# Patient Record
Sex: Male | Born: 1935 | Race: White | Hispanic: No | Smoking: Former smoker
Health system: Southern US, Community
[De-identification: ages and names within clinical notes are randomized; demographics above are authoritative.]

## PROBLEM LIST (undated history)

## (undated) DIAGNOSIS — H9190 Unspecified hearing loss, unspecified ear: Secondary | ICD-10-CM

## (undated) DIAGNOSIS — R269 Unspecified abnormalities of gait and mobility: Secondary | ICD-10-CM

## (undated) DIAGNOSIS — D649 Anemia, unspecified: Secondary | ICD-10-CM

## (undated) DIAGNOSIS — E785 Hyperlipidemia, unspecified: Secondary | ICD-10-CM

## (undated) DIAGNOSIS — I10 Essential (primary) hypertension: Secondary | ICD-10-CM

## (undated) DIAGNOSIS — E119 Type 2 diabetes mellitus without complications: Secondary | ICD-10-CM

## (undated) DIAGNOSIS — S065X9A Traumatic subdural hemorrhage with loss of consciousness of unspecified duration, initial encounter: Secondary | ICD-10-CM

## (undated) DIAGNOSIS — E871 Hypo-osmolality and hyponatremia: Secondary | ICD-10-CM

## (undated) HISTORY — PX: CERVICAL SPINE SURGERY: SHX589

---
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
## 2015-06-26 ENCOUNTER — Emergency Department (HOSPITAL_BASED_OUTPATIENT_CLINIC_OR_DEPARTMENT_OTHER): Payer: Medicare Other

## 2015-06-26 ENCOUNTER — Inpatient Hospital Stay (HOSPITAL_BASED_OUTPATIENT_CLINIC_OR_DEPARTMENT_OTHER)
Admission: EM | Admit: 2015-06-26 | Discharge: 2015-07-01 | DRG: 083 | Disposition: A | Discharge status: Rehabilitation Facility | Payer: Medicare Other | Attending: General Surgery | Admitting: General Surgery

## 2015-06-26 DIAGNOSIS — R5383 Other fatigue: Secondary | ICD-10-CM | POA: Diagnosis not present

## 2015-06-26 DIAGNOSIS — D62 Acute posthemorrhagic anemia: Secondary | ICD-10-CM | POA: Diagnosis present

## 2015-06-26 DIAGNOSIS — E878 Other disorders of electrolyte and fluid balance, not elsewhere classified: Secondary | ICD-10-CM | POA: Diagnosis not present

## 2015-06-26 DIAGNOSIS — I4891 Unspecified atrial fibrillation: Secondary | ICD-10-CM | POA: Diagnosis present

## 2015-06-26 DIAGNOSIS — E876 Hypokalemia: Secondary | ICD-10-CM | POA: Diagnosis not present

## 2015-06-26 DIAGNOSIS — S065X9A Traumatic subdural hemorrhage with loss of consciousness of unspecified duration, initial encounter: Secondary | ICD-10-CM | POA: Diagnosis present

## 2015-06-26 DIAGNOSIS — E785 Hyperlipidemia, unspecified: Secondary | ICD-10-CM | POA: Diagnosis present

## 2015-06-26 DIAGNOSIS — E871 Hypo-osmolality and hyponatremia: Secondary | ICD-10-CM | POA: Diagnosis present

## 2015-06-26 DIAGNOSIS — Z7984 Long term (current) use of oral hypoglycemic drugs: Secondary | ICD-10-CM | POA: Diagnosis not present

## 2015-06-26 DIAGNOSIS — R269 Unspecified abnormalities of gait and mobility: Secondary | ICD-10-CM | POA: Diagnosis not present

## 2015-06-26 DIAGNOSIS — W1830XA Fall on same level, unspecified, initial encounter: Secondary | ICD-10-CM | POA: Diagnosis present

## 2015-06-26 DIAGNOSIS — I951 Orthostatic hypotension: Secondary | ICD-10-CM | POA: Diagnosis not present

## 2015-06-26 DIAGNOSIS — S069X2S Unspecified intracranial injury with loss of consciousness of 31 minutes to 59 minutes, sequela: Secondary | ICD-10-CM | POA: Diagnosis not present

## 2015-06-26 DIAGNOSIS — Z7982 Long term (current) use of aspirin: Secondary | ICD-10-CM

## 2015-06-26 DIAGNOSIS — I10 Essential (primary) hypertension: Secondary | ICD-10-CM | POA: Diagnosis present

## 2015-06-26 DIAGNOSIS — R296 Repeated falls: Secondary | ICD-10-CM | POA: Diagnosis present

## 2015-06-26 DIAGNOSIS — H9190 Unspecified hearing loss, unspecified ear: Secondary | ICD-10-CM | POA: Diagnosis present

## 2015-06-26 DIAGNOSIS — S065XAA Traumatic subdural hemorrhage with loss of consciousness status unknown, initial encounter: Secondary | ICD-10-CM | POA: Insufficient documentation

## 2015-06-26 DIAGNOSIS — S069X3S Unspecified intracranial injury with loss of consciousness of 1 hour to 5 hours 59 minutes, sequela: Secondary | ICD-10-CM | POA: Diagnosis not present

## 2015-06-26 DIAGNOSIS — Y92002 Bathroom of unspecified non-institutional (private) residence single-family (private) house as the place of occurrence of the external cause: Secondary | ICD-10-CM | POA: Diagnosis not present

## 2015-06-26 DIAGNOSIS — E118 Type 2 diabetes mellitus with unspecified complications: Secondary | ICD-10-CM | POA: Diagnosis not present

## 2015-06-26 DIAGNOSIS — E119 Type 2 diabetes mellitus without complications: Secondary | ICD-10-CM | POA: Diagnosis present

## 2015-06-26 DIAGNOSIS — S065X3A Traumatic subdural hemorrhage with loss of consciousness of 1 hour to 5 hours 59 minutes, initial encounter: Secondary | ICD-10-CM | POA: Diagnosis present

## 2015-06-26 DIAGNOSIS — Z79899 Other long term (current) drug therapy: Secondary | ICD-10-CM | POA: Diagnosis not present

## 2015-06-26 DIAGNOSIS — R402412 Glasgow coma scale score 13-15, at arrival to emergency department: Secondary | ICD-10-CM | POA: Diagnosis present

## 2015-06-26 DIAGNOSIS — F068 Other specified mental disorders due to known physiological condition: Secondary | ICD-10-CM | POA: Insufficient documentation

## 2015-06-26 DIAGNOSIS — S069X0S Unspecified intracranial injury without loss of consciousness, sequela: Secondary | ICD-10-CM

## 2015-06-26 DIAGNOSIS — R350 Frequency of micturition: Secondary | ICD-10-CM | POA: Diagnosis not present

## 2015-06-26 DIAGNOSIS — R413 Other amnesia: Secondary | ICD-10-CM | POA: Diagnosis not present

## 2015-06-26 DIAGNOSIS — I6201 Nontraumatic acute subdural hemorrhage: Secondary | ICD-10-CM | POA: Diagnosis present

## 2015-06-26 HISTORY — DX: Anemia, unspecified: D64.9

## 2015-06-26 HISTORY — DX: Hyperlipidemia, unspecified: E78.5

## 2015-06-26 HISTORY — DX: Unspecified abnormalities of gait and mobility: R26.9

## 2015-06-26 HISTORY — DX: Essential (primary) hypertension: I10

## 2015-06-26 HISTORY — DX: Type 2 diabetes mellitus without complications: E11.9

## 2015-06-26 HISTORY — DX: Unspecified hearing loss, unspecified ear: H91.90

## 2015-06-26 HISTORY — DX: Hypo-osmolality and hyponatremia: E87.1

## 2015-06-26 HISTORY — DX: Traumatic subdural hemorrhage with loss of consciousness of unspecified duration, initial encounter: S06.5X9A

## 2015-06-26 LAB — BASIC METABOLIC PANEL
ANION GAP: 7 (ref 5–15)
BUN: 13 mg/dL (ref 6–20)
CO2: 30 mmol/L (ref 22–32)
Calcium: 9.3 mg/dL (ref 8.9–10.3)
Chloride: 95 mmol/L — ABNORMAL LOW (ref 101–111)
Creatinine, Ser: 0.86 mg/dL (ref 0.61–1.24)
GFR calc Af Amer: >60 mL/min (ref >60)
GFR calc non Af Amer: >60 mL/min (ref >60)
GLUCOSE: 136 mg/dL — AB (ref 65–99)
Potassium: 4 mmol/L (ref 3.5–5.1)
SODIUM: 132 mmol/L — AB (ref 135–145)

## 2015-06-26 LAB — APTT: aPTT: 34 seconds (ref 24–37)

## 2015-06-26 LAB — CBC
HEMATOCRIT: 36.3 % — AB (ref 39.0–52.0)
HEMOGLOBIN: 12.5 g/dL — AB (ref 13.0–17.0)
MCH: 30.4 pg (ref 26.0–34.0)
MCHC: 34.4 g/dL (ref 30.0–36.0)
MCV: 88.3 fL (ref 78.0–100.0)
Platelets: 176 10*3/uL (ref 150–400)
RBC: 4.11 MIL/uL — ABNORMAL LOW (ref 4.22–5.81)
RDW: 12.9 % (ref 11.5–15.5)
WBC: 4.9 10*3/uL (ref 4.0–10.5)

## 2015-06-26 LAB — PROTIME-INR
INR: 0.92 (ref 0.00–1.49)
Prothrombin Time: 12.6 seconds (ref 11.6–15.2)

## 2015-06-26 LAB — CBG MONITORING, ED: Glucose-Capillary: 123 mg/dL — ABNORMAL HIGH (ref 65–99)

## 2015-06-26 MED ORDER — DEXTROSE-NACL 5-0.9 % IV SOLN
INTRAVENOUS | Status: DC
  Administered 2015-06-27 – 2015-06-28 (×2): via INTRAVENOUS

## 2015-06-26 MED ORDER — AMITRIPTYLINE HCL 10 MG PO TABS
10.0000 mg | ORAL_TABLET | Freq: Every day | ORAL | Status: DC
  Administered 2015-06-27 – 2015-06-30 (×4): 10 mg via ORAL
  Filled 2015-06-26 (×6): qty 1

## 2015-06-26 MED ORDER — CITALOPRAM HYDROBROMIDE 20 MG PO TABS
10.0000 mg | ORAL_TABLET | Freq: Every day | ORAL | Status: DC
  Administered 2015-06-27 – 2015-07-01 (×5): 10 mg via ORAL
  Filled 2015-06-26 (×5): qty 1

## 2015-06-26 MED ORDER — CITALOPRAM HYDROBROMIDE 10 MG PO TABS: 10.0000 mg | ORAL_TABLET | Freq: Every day | ORAL | Status: DC

## 2015-06-26 MED ORDER — LISINOPRIL 40 MG PO TABS
40.0000 mg | ORAL_TABLET | Freq: Every day | ORAL | Status: DC
  Administered 2015-06-27 – 2015-07-01 (×5): 40 mg via ORAL
  Filled 2015-06-26 (×5): qty 1

## 2015-06-26 MED ORDER — PANTOPRAZOLE SODIUM 40 MG IV SOLR: 40.0000 mg | Freq: Every day | INTRAVENOUS | Status: DC

## 2015-06-26 MED ORDER — SIMVASTATIN 40 MG PO TABS
40.0000 mg | ORAL_TABLET | Freq: Every day | ORAL | Status: DC
  Administered 2015-06-27 – 2015-06-30 (×4): 40 mg via ORAL
  Filled 2015-06-26 (×4): qty 1

## 2015-06-26 MED ORDER — HYDROCHLOROTHIAZIDE 25 MG PO TABS
25.0000 mg | ORAL_TABLET | Freq: Every day | ORAL | Status: DC
  Administered 2015-06-27 – 2015-07-01 (×5): 25 mg via ORAL
  Filled 2015-06-26 (×5): qty 1

## 2015-06-26 MED ORDER — INSULIN ASPART 100 UNIT/ML ~~LOC~~ SOLN
0.0000 [IU] | Freq: Three times a day (TID) | SUBCUTANEOUS | Status: DC
  Administered 2015-06-27: 2 [IU] via SUBCUTANEOUS
  Administered 2015-06-27 – 2015-06-28 (×3): 3 [IU] via SUBCUTANEOUS
  Administered 2015-06-28 – 2015-07-01 (×6): 2 [IU] via SUBCUTANEOUS

## 2015-06-26 MED ORDER — PANTOPRAZOLE SODIUM 40 MG PO TBEC
40.0000 mg | DELAYED_RELEASE_TABLET | Freq: Every day | ORAL | Status: DC
  Administered 2015-06-27 – 2015-06-29 (×3): 40 mg via ORAL
  Filled 2015-06-26 (×3): qty 1

## 2015-06-26 MED ORDER — ACETAMINOPHEN 500 MG PO TABS
1000.0000 mg | ORAL_TABLET | Freq: Once | ORAL | Status: AC
  Administered 2015-06-26: 1000 mg via ORAL
  Filled 2015-06-26: qty 2

## 2015-06-26 MED ORDER — ONDANSETRON HCL 4 MG/2ML IJ SOLN
4.0000 mg | Freq: Four times a day (QID) | INTRAMUSCULAR | Status: DC | PRN
  Administered 2015-06-27 – 2015-06-30 (×2): 4 mg via INTRAVENOUS
  Filled 2015-06-26 (×3): qty 2

## 2015-06-26 MED ORDER — SIMVASTATIN 40 MG PO TABS: 40.0000 mg | ORAL_TABLET | Freq: Every day | ORAL | Status: DC

## 2015-06-26 MED ORDER — HYDROMORPHONE HCL 1 MG/ML IJ SOLN
0.5000 mg | INTRAMUSCULAR | Status: DC | PRN
  Administered 2015-06-27: 0.5 mg via INTRAVENOUS
  Filled 2015-06-26: qty 1

## 2015-06-26 MED ORDER — LISINOPRIL 40 MG PO TABS: 40.0000 mg | ORAL_TABLET | Freq: Every day | ORAL | Status: DC

## 2015-06-26 MED ORDER — METFORMIN HCL 500 MG PO TABS
500.0000 mg | ORAL_TABLET | Freq: Two times a day (BID) | ORAL | Status: DC
  Administered 2015-06-27 – 2015-07-01 (×9): 500 mg via ORAL
  Filled 2015-06-26 (×9): qty 1

## 2015-06-26 MED ORDER — ONDANSETRON HCL 4 MG PO TABS
4.0000 mg | ORAL_TABLET | Freq: Four times a day (QID) | ORAL | Status: DC | PRN
  Filled 2015-06-26: qty 1

## 2015-06-26 MED ORDER — HYDROCHLOROTHIAZIDE 25 MG PO TABS: 25.0000 mg | ORAL_TABLET | Freq: Every day | ORAL | Status: DC

## 2015-06-26 NOTE — ED Notes (Signed)
Trauma at bedside.

## 2015-06-26 NOTE — ED Notes (Signed)
Pt on automatic VS, continuous pulse ox and cardiac monitoring. 

## 2015-06-26 NOTE — ED Notes (Signed)
Neurosurgery and Trauma notified of patient's arrival

## 2015-06-26 NOTE — ED Notes (Signed)
Pt used urinal in bed to void.

## 2015-06-26 NOTE — H&P (Signed)
History   Steven Duncan is an 82 y.o. male.   Chief Complaint:  Chief Complaint  Patient presents with  . Fall    Fall Associated symptoms include neck pain. Pertinent negatives include no abdominal pain, chest pain or fever.   Asked to see patient after a fall in shower earlier today. Patient struck his head and states he was knocked unconscious. He was seen at the [place] emergency room and found to have small subdural hematoma. Glasgow Coma Scale was 15. Transferred to [place] for neurosurgical and trauma evaluation. CT scan of head shows small subdural hematoma and CT of neck shows chronic postsurgical changes without acute fracture. Patient is hard of hearing. Complains of a headache currently and neck pain. Denies chest pain, abdominal pain, extremity pain, back pain, or pelvic pain. No past medical history on file.  No past surgical history on file.  No family history on file. Social History:  has no tobacco, alcohol, and drug history on file.  Allergies  No Known Allergies  Home Medications   (Not in a hospital admission)  Trauma Course   Results for orders placed or performed during the hospital encounter of 06/26/15 (from the past 48 hour(s))  Basic metabolic panel     Status: Abnormal   Collection Time: 06/26/15  3:25 PM  Result Value Ref Range   Sodium 132 (L) 135 - 145 mmol/L   Potassium 4.0 3.5 - 5.1 mmol/L   Chloride 95 (L) 101 - 111 mmol/L   CO2 30 22 - 32 mmol/L   Glucose, Bld 136 (H) 65 - 99 mg/dL   BUN 13 6 - 20 mg/dL   Creatinine, Ser 0.86 0.61 - 1.24 mg/dL   Calcium 9.3 8.9 - 10.3 mg/dL   GFR calc non Af Amer >60 >60 mL/min   GFR calc Af Amer >60 >60 mL/min    Comment: (NOTE) The eGFR has been calculated using the CKD EPI equation. This calculation has not been validated in all clinical situations. eGFR's persistently <60 mL/min signify possible Chronic Kidney Disease.    Anion gap 7 5 - 15  CBC     Status: Abnormal   Collection Time:  06/26/15  3:25 PM  Result Value Ref Range   WBC 4.9 4.0 - 10.5 K/uL   RBC 4.11 (L) 4.22 - 5.81 MIL/uL   Hemoglobin 12.5 (L) 13.0 - 17.0 g/dL   HCT 36.3 (L) 39.0 - 52.0 %   MCV 88.3 78.0 - 100.0 fL   MCH 30.4 26.0 - 34.0 pg   MCHC 34.4 30.0 - 36.0 g/dL   RDW 12.9 11.5 - 15.5 %   Platelets 176 150 - 400 K/uL  APTT     Status: None   Collection Time: 06/26/15  3:25 PM  Result Value Ref Range   aPTT 34 24 - 37 seconds  Protime-INR     Status: None   Collection Time: 06/26/15  3:25 PM  Result Value Ref Range   Prothrombin Time 12.6 11.6 - 15.2 seconds   INR 0.92 0.00 - 1.49  CBG monitoring, ED     Status: Abnormal   Collection Time: 06/26/15  4:07 PM  Result Value Ref Range   Glucose-Capillary 123 (H) 65 - 99 mg/dL   Ct Head Wo Contrast  06/26/2015  CLINICAL DATA:  Posttraumatic headache and head laceration and left-sided neck pain after fall in shower today. Positive loss of consciousness. EXAM: CT HEAD WITHOUT CONTRAST CT CERVICAL SPINE WITHOUT CONTRAST TECHNIQUE: Multidetector  CT imaging of the head and cervical spine was performed following the standard protocol without intravenous contrast. Multiplanar CT image reconstructions of the cervical spine were also generated. COMPARISON:  None. FINDINGS: CT HEAD FINDINGS Bony calvarium appears intact. Mild left parasagittal subdural hematoma is noted. Mild right tentorial subdural hematoma is noted as well. Ventricular size is within normal limits. No midline shift is noted. Mild diffuse cortical atrophy is noted. There is no evidence of acute infarction or mass lesion. Mild left occipital scalp hematoma is noted. CT CERVICAL SPINE FINDINGS Mild grade 1 anterolisthesis of C4-5 is noted. Patient is status post left hemilaminectomy at C5 and C6. Small defect is seen involving the posterior arc of C1 which most likely represents old fracture. No definite acute fracture is noted. There is noted and old fracture involving the superior tip of the  odontoid process with corticated margins. Visualized lung apices appear normal. Severe degenerative disc disease is noted at C4-5, C5-6, C6-7 and C7-T1. IMPRESSION: Mild diffuse cortical atrophy. Mild left parasagittal and right tentorial subdural hematomas are noted. No significant midline shift is noted. Extensive degenerative and postsurgical changes are noted. Probable old odontoid and C1 posterior arch fractures are noted. No definite evidence of acute fracture is noted. Critical Value/emergent results were called by telephone at the time of interpretation on 06/26/2015 at 5:05 pm to Dr. Orlie Dakin , who verbally acknowledged these results. Electronically Signed   By: Marijo Conception, M.D.   On: 06/26/2015 17:05   Ct Cervical Spine Wo Contrast  06/26/2015  CLINICAL DATA:  Posttraumatic headache and head laceration and left-sided neck pain after fall in shower today. Positive loss of consciousness. EXAM: CT HEAD WITHOUT CONTRAST CT CERVICAL SPINE WITHOUT CONTRAST TECHNIQUE: Multidetector CT imaging of the head and cervical spine was performed following the standard protocol without intravenous contrast. Multiplanar CT image reconstructions of the cervical spine were also generated. COMPARISON:  None. FINDINGS: CT HEAD FINDINGS Bony calvarium appears intact. Mild left parasagittal subdural hematoma is noted. Mild right tentorial subdural hematoma is noted as well. Ventricular size is within normal limits. No midline shift is noted. Mild diffuse cortical atrophy is noted. There is no evidence of acute infarction or mass lesion. Mild left occipital scalp hematoma is noted. CT CERVICAL SPINE FINDINGS Mild grade 1 anterolisthesis of C4-5 is noted. Patient is status post left hemilaminectomy at C5 and C6. Small defect is seen involving the posterior arc of C1 which most likely represents old fracture. No definite acute fracture is noted. There is noted and old fracture involving the superior tip of the odontoid  process with corticated margins. Visualized lung apices appear normal. Severe degenerative disc disease is noted at C4-5, C5-6, C6-7 and C7-T1. IMPRESSION: Mild diffuse cortical atrophy. Mild left parasagittal and right tentorial subdural hematomas are noted. No significant midline shift is noted. Extensive degenerative and postsurgical changes are noted. Probable old odontoid and C1 posterior arch fractures are noted. No definite evidence of acute fracture is noted. Critical Value/emergent results were called by telephone at the time of interpretation on 06/26/2015 at 5:05 pm to Dr. Orlie Dakin , who verbally acknowledged these results. Electronically Signed   By: Marijo Conception, M.D.   On: 06/26/2015 17:05    Review of Systems  Constitutional: Negative for fever.  HENT: Positive for hearing loss.   Eyes: Negative for blurred vision and double vision.  Respiratory: Negative for shortness of breath.   Cardiovascular: Negative for chest pain.  Gastrointestinal: Negative for  abdominal pain.  Musculoskeletal: Positive for falls and neck pain. Negative for back pain.  Endo/Heme/Allergies: Bruises/bleeds easily.  Psychiatric/Behavioral: Negative.     Blood pressure 165/89, pulse 87, temperature 98.1 F (36.7 C), temperature source Oral, resp. rate 20, SpO2 98 %. Physical Exam  Constitutional: He is oriented to person, place, and time. He appears well-developed and well-nourished.  HENT:  Head:    Eyes: Pupils are equal, round, and reactive to light. No scleral icterus.  Neck: Normal range of motion. Neck supple.  No bony tenderness full range of motion without pain  Cardiovascular: Normal rate.   Respiratory: Effort normal and breath sounds normal.  GI: Soft.  Musculoskeletal: Normal range of motion.  Neurological: He is alert and oriented to person, place, and time. He has normal strength. No sensory deficit. GCS eye subscore is 4. GCS verbal subscore is 5. GCS motor subscore is 6.    Skin: Skin is warm and dry.  Psychiatric: He has a normal mood and affect. His behavior is normal.     Assessment/Plan  Fall from level ground with LOC   Subdural hematoma    Dr Cyndy Freeze aware   Diabetes mellitus type 2   SSI   HTN  PLACE BACK ON MEDS    Admit to stepdown    Mikyah Alamo A. 06/26/2015, 7:53 PM   Procedures

## 2015-06-26 NOTE — ED Notes (Signed)
Pt had fall earlier today in shower, +LOC, pt on ASA.  Family came over to his house and he was wondering around house disoriented.  PT A/O x 3 today

## 2015-06-26 NOTE — ED Provider Notes (Addendum)
CSN: [id number]     Arrival date & time 06/26/15  1512 History   None    Chief Complaint  Patient presents with  . Fall     (Consider location/radiation/quality/duration/timing/severity/associated sxs/prior Treatment) HPI Patient fell in the shower 1:30 PM today. He struck his head and suffered loss of consciousness lasting approximately one hour. He was confused after the event. He and his daughter who accompany him and lives with him He doubt that he had a syncopal event. His daughter reports that he suffers from frequent falls and he is followed in the shower in the past. He complains of headache and neck pain since the event. He was nauseated and confused earlier today immediately following the fall but is no longer nauseated and acting as normal self per his daughter No past medical history on file. No past surgical history on file. Hypertension diabetes No family history on file. Social History  Substance Use Topics  . Smoking status: Not on file  . Smokeless tobacco: Not on file  . Alcohol Use: Not on file  No tobacco no alcohol no drugs  Review of Systems  Constitutional: Negative.   HENT: Positive for hearing loss.        Hard of hearing  Respiratory: Negative.   Cardiovascular: Negative.   Gastrointestinal: Positive for nausea.  Musculoskeletal: Positive for gait problem.        Walks with cane  Skin: Positive for wound.  Allergic/Immunologic: Positive for immunocompromised state.       Diabetic  Neurological: Positive for headaches.  Psychiatric/Behavioral: Negative.   All other systems reviewed and are negative.     Allergies  Review of patient's allergies indicates no known allergies.  Home Medications   Prior to Admission medications   Medication Sig Start Date End Date Taking? Authorizing Provider  amitriptyline (ELAVIL) 10 MG tablet Take 10 mg by mouth at bedtime.   Yes Historical Provider, MD  aspirin 81 MG tablet Take 81 mg by mouth daily.   Yes  Historical Provider, MD  citalopram (CELEXA) 10 MG tablet Take 10 mg by mouth daily.   Yes Historical Provider, MD  Fish Oil-Cholecalciferol (FISH OIL + D3 PO) Take by mouth.   Yes Historical Provider, MD  glucosamine-chondroitin 500-400 MG tablet Take 1 tablet by mouth 3 (three) times daily.   Yes Historical Provider, MD  glucose blood test strip 1 each by Other route as needed for other. Use as instructed   Yes Historical Provider, MD  hydrochlorothiazide (HYDRODIURIL) 25 MG tablet Take 25 mg by mouth daily.   Yes Historical Provider, MD  Lancets MISC by Does not apply route.   Yes Historical Provider, MD  lisinopril (PRINIVIL,ZESTRIL) 40 MG tablet Take 40 mg by mouth daily.   Yes Historical Provider, MD  metFORMIN (GLUCOPHAGE) 500 MG tablet Take by mouth 2 (two) times daily with a meal.   Yes Historical Provider, MD  simvastatin (ZOCOR) 40 MG tablet Take 40 mg by mouth daily.   Yes Historical Provider, MD   BP 186/103 mmHg  Pulse 76  Temp(Src) 97.8 F (36.6 C) (Oral)  Resp 20  SpO2 99% Physical Exam  Constitutional: He is oriented to person, place, and time. He appears well-developed and well-nourished. No distress.  Alert Glasgow Coma Score 15  HENT:  Hematoma with 1 cm laceration over left mastoid process. No active bleeding. 2 mm laceration over left forehead with no surrounding hematoma. Otherwise normocephalic atraumatic  Eyes: Conjunctivae are normal. Pupils are equal, round,  and reactive to light.  Neck: Neck supple. No tracheal deviation present. No thyromegaly present.  Mild midline tenderness posteriorly to no deformity  Cardiovascular: Normal rate and regular rhythm.   No murmur heard. Pulmonary/Chest: Effort normal and breath sounds normal.  Abdominal: Soft. Bowel sounds are normal. He exhibits no distension. There is no tenderness.  Musculoskeletal: Normal range of motion. He exhibits no edema or tenderness.  Neurological: He is alert and oriented to person, place, and  time. No cranial nerve deficit. Coordination normal.  Motor strength 5 over 5 overall  Skin: Skin is warm and dry. No rash noted.  Psychiatric: He has a normal mood and affect.  Nursing note and vitals reviewed.   ED Course  Procedures (including critical care time) Labs Review Labs Reviewed  CBC - Abnormal; Notable for the following:    RBC 4.11 (*)    Hemoglobin 12.5 (*)    HCT 36.3 (*)    All other components within normal limits  BASIC METABOLIC PANEL  POCT CBG (FASTING - GLUCOSE)-MANUAL ENTRY    Imaging Review No results found. I have personally reviewed and evaluated these images and lab results as part of my medical decision-making.   EKG Interpretation   Date/Time:  Friday June 26 2015 15:49:29 EDT Ventricular Rate:  73 PR Interval:  244 QRS Duration: 104 QT Interval:  450 QTC Calculation: 496 R Axis:   42 Text Interpretation:  Sinus rhythm Prolonged PR interval Abnormal R-wave  progression, early transition Borderline prolonged QT interval Baseline  wander in lead(s) V5 No old tracing to compare Confirmed by Ethelda Chick   MD, Lillionna Nabi [id number]) on 06/26/2015 3:56:42 PM     5:30 PM patient is alert Glasgow Coma Score 15. Offers no distress. Results for orders placed or performed during the hospital encounter of 06/26/15  Basic metabolic panel  Result Value Ref Range   Sodium 132 (L) 135 - 145 mmol/L   Potassium 4.0 3.5 - 5.1 mmol/L   Chloride 95 (L) 101 - 111 mmol/L   CO2 30 22 - 32 mmol/L   Glucose, Bld 136 (H) 65 - 99 mg/dL   BUN 13 6 - 20 mg/dL   Creatinine, Ser 6.04 0.61 - 1.24 mg/dL   Calcium 9.3 8.9 - 54.0 mg/dL   GFR calc non Af Amer >60 >60 mL/min   GFR calc Af Amer >60 >60 mL/min   Anion gap 7 5 - 15  CBC  Result Value Ref Range   WBC 4.9 4.0 - 10.5 K/uL   RBC 4.11 (L) 4.22 - 5.81 MIL/uL   Hemoglobin 12.5 (L) 13.0 - 17.0 g/dL   HCT 98.1 (L) 19.1 - 47.8 %   MCV 88.3 78.0 - 100.0 fL   MCH 30.4 26.0 - 34.0 pg   MCHC 34.4 30.0 - 36.0 g/dL   RDW  29.5 62.1 - 30.8 %   Platelets 176 150 - 400 K/uL  APTT  Result Value Ref Range   aPTT 34 24 - 37 seconds  Protime-INR  Result Value Ref Range   Prothrombin Time 12.6 11.6 - 15.2 seconds   INR 0.92 0.00 - 1.49  CBG monitoring, ED  Result Value Ref Range   Glucose-Capillary 123 (H) 65 - 99 mg/dL   Ct Head Wo Contrast  06/26/2015  CLINICAL DATA:  Posttraumatic headache and head laceration and left-sided neck pain after fall in shower today. Positive loss of consciousness. EXAM: CT HEAD WITHOUT CONTRAST CT CERVICAL SPINE WITHOUT CONTRAST TECHNIQUE: Multidetector CT imaging of  the head and cervical spine was performed following the standard protocol without intravenous contrast. Multiplanar CT image reconstructions of the cervical spine were also generated. COMPARISON:  None. FINDINGS: CT HEAD FINDINGS Bony calvarium appears intact. Mild left parasagittal subdural hematoma is noted. Mild right tentorial subdural hematoma is noted as well. Ventricular size is within normal limits. No midline shift is noted. Mild diffuse cortical atrophy is noted. There is no evidence of acute infarction or mass lesion. Mild left occipital scalp hematoma is noted. CT CERVICAL SPINE FINDINGS Mild grade 1 anterolisthesis of C4-5 is noted. Patient is status post left hemilaminectomy at C5 and C6. Small defect is seen involving the posterior arc of C1 which most likely represents old fracture. No definite acute fracture is noted. There is noted and old fracture involving the superior tip of the odontoid process with corticated margins. Visualized lung apices appear normal. Severe degenerative disc disease is noted at C4-5, C5-6, C6-7 and C7-T1. IMPRESSION: Mild diffuse cortical atrophy. Mild left parasagittal and right tentorial subdural hematomas are noted. No significant midline shift is noted. Extensive degenerative and postsurgical changes are noted. Probable old odontoid and C1 posterior arch fractures are noted. No  definite evidence of acute fracture is noted. Critical Value/emergent results were called by telephone at the time of interpretation on 06/26/2015 at 5:05 pm to Dr. Doug Sou , who verbally acknowledged these results. Electronically Signed   By: Lupita Raider, M.D.   On: 06/26/2015 17:05   Ct Cervical Spine Wo Contrast  06/26/2015  CLINICAL DATA:  Posttraumatic headache and head laceration and left-sided neck pain after fall in shower today. Positive loss of consciousness. EXAM: CT HEAD WITHOUT CONTRAST CT CERVICAL SPINE WITHOUT CONTRAST TECHNIQUE: Multidetector CT imaging of the head and cervical spine was performed following the standard protocol without intravenous contrast. Multiplanar CT image reconstructions of the cervical spine were also generated. COMPARISON:  None. FINDINGS: CT HEAD FINDINGS Bony calvarium appears intact. Mild left parasagittal subdural hematoma is noted. Mild right tentorial subdural hematoma is noted as well. Ventricular size is within normal limits. No midline shift is noted. Mild diffuse cortical atrophy is noted. There is no evidence of acute infarction or mass lesion. Mild left occipital scalp hematoma is noted. CT CERVICAL SPINE FINDINGS Mild grade 1 anterolisthesis of C4-5 is noted. Patient is status post left hemilaminectomy at C5 and C6. Small defect is seen involving the posterior arc of C1 which most likely represents old fracture. No definite acute fracture is noted. There is noted and old fracture involving the superior tip of the odontoid process with corticated margins. Visualized lung apices appear normal. Severe degenerative disc disease is noted at C4-5, C5-6, C6-7 and C7-T1. IMPRESSION: Mild diffuse cortical atrophy. Mild left parasagittal and right tentorial subdural hematomas are noted. No significant midline shift is noted. Extensive degenerative and postsurgical changes are noted. Probable old odontoid and C1 posterior arch fractures are noted. No definite  evidence of acute fracture is noted. Critical Value/emergent results were called by telephone at the time of interpretation on 06/26/2015 at 5:05 pm to Dr. Doug Sou , who verbally acknowledged these results. Electronically Signed   By: Lupita Raider, M.D.   On: 06/26/2015 17:05    MDM  Lacerations do not require repair. Patient is up-to-date on tetanus immunization. I consulted Dr.Ditty neurosurgery who defers admission to the trauma service. He feels that patient be watched overnight on step down unit He will see patient in consultation. I consulted Dr. Luisa Hart  who [facility]. He accepts patient in transfer. I also spoke with Dr.Mckeun, emergency physician who is aware of patient's arrival Final diagnoses:  None   Dx #1 acute subdural hematoma #2 hyperglycermia #3 elevated blood pressure CRITICAL CARE Performed by: Doug SouJACUBOWITZ,Benjie Ricketson Total critical care time: 30 minutes Critical care time was exclusive of separately billable procedures and treating other patients. Critical care was necessary to treat or prevent imminent or life-threatening deterioration. Critical care was time spent personally by me on the following activities: development of treatment plan with patient and/or surrogate as well as nursing, discussions with consultants, evaluation of patient's response to treatment, examination of patient, obtaining history from patient or surrogate, ordering and performing treatments and interventions, ordering and review of laboratory studies, ordering and review of radiographic studies, pulse oximetry and re-evaluation of patient's condition.    Doug SouSam Naz Denunzio, MD 06/26/15 Elfredia [id number]  Doug SouSam Prudencio Velazco, MD 06/26/15 [phone]

## 2015-06-27 DIAGNOSIS — S065X9A Traumatic subdural hemorrhage with loss of consciousness of unspecified duration, initial encounter: Secondary | ICD-10-CM

## 2015-06-27 DIAGNOSIS — S065XAA Traumatic subdural hemorrhage with loss of consciousness status unknown, initial encounter: Secondary | ICD-10-CM

## 2015-06-27 DIAGNOSIS — E871 Hypo-osmolality and hyponatremia: Secondary | ICD-10-CM

## 2015-06-27 HISTORY — DX: Traumatic subdural hemorrhage with loss of consciousness of unspecified duration, initial encounter: S06.5X9A

## 2015-06-27 HISTORY — DX: Traumatic subdural hemorrhage with loss of consciousness status unknown, initial encounter: S06.5XAA

## 2015-06-27 HISTORY — DX: Hypo-osmolality and hyponatremia: E87.1

## 2015-06-27 LAB — CBC
HCT: 36 % — ABNORMAL LOW (ref 39.0–52.0)
Hemoglobin: 12.1 g/dL — ABNORMAL LOW (ref 13.0–17.0)
MCH: 29 pg (ref 26.0–34.0)
MCHC: 33.6 g/dL (ref 30.0–36.0)
MCV: 86.3 fL (ref 78.0–100.0)
PLATELETS: 175 10*3/uL (ref 150–400)
RBC: 4.17 MIL/uL — ABNORMAL LOW (ref 4.22–5.81)
RDW: 13.1 % (ref 11.5–15.5)
WBC: 5.8 10*3/uL (ref 4.0–10.5)

## 2015-06-27 LAB — COMPREHENSIVE METABOLIC PANEL
ALT: 19 U/L (ref 17–63)
AST: 29 U/L (ref 15–41)
Albumin: 3.8 g/dL (ref 3.5–5.0)
Alkaline Phosphatase: 44 U/L (ref 38–126)
Anion gap: 9 (ref 5–15)
BUN: 6 mg/dL (ref 6–20)
CALCIUM: 8.5 mg/dL — AB (ref 8.9–10.3)
CO2: 26 mmol/L (ref 22–32)
CREATININE: 0.8 mg/dL (ref 0.61–1.24)
Chloride: 92 mmol/L — ABNORMAL LOW (ref 101–111)
GFR calc Af Amer: >60 mL/min (ref >60)
Glucose, Bld: 181 mg/dL — ABNORMAL HIGH (ref 65–99)
Potassium: 3 mmol/L — ABNORMAL LOW (ref 3.5–5.1)
SODIUM: 127 mmol/L — AB (ref 135–145)
TOTAL PROTEIN: 6.7 g/dL (ref 6.5–8.1)
Total Bilirubin: 0.8 mg/dL (ref 0.3–1.2)

## 2015-06-27 LAB — GLUCOSE, CAPILLARY
GLUCOSE-CAPILLARY: 150 mg/dL — AB (ref 65–99)
GLUCOSE-CAPILLARY: 159 mg/dL — AB (ref 65–99)
GLUCOSE-CAPILLARY: 137 mg/dL — AB (ref 65–99)
Glucose-Capillary: 200 mg/dL — ABNORMAL HIGH (ref 65–99)
Glucose-Capillary: 138 mg/dL — ABNORMAL HIGH (ref 65–99)

## 2015-06-27 LAB — TROPONIN I

## 2015-06-27 LAB — MRSA PCR SCREENING: MRSA by PCR: NEGATIVE

## 2015-06-27 LAB — MAGNESIUM: Magnesium: 1.7 mg/dL (ref 1.7–2.4)

## 2015-06-27 MED ORDER — MAGNESIUM SULFATE 2 GM/50ML IV SOLN
2.0000 g | Freq: Once | INTRAVENOUS | Status: AC
  Administered 2015-06-27: 2 g via INTRAVENOUS
  Filled 2015-06-27: qty 50

## 2015-06-27 MED ORDER — POTASSIUM CHLORIDE CRYS ER 20 MEQ PO TBCR
40.0000 meq | EXTENDED_RELEASE_TABLET | Freq: Every day | ORAL | Status: DC
  Administered 2015-06-27 – 2015-07-01 (×5): 40 meq via ORAL
  Filled 2015-06-27 (×5): qty 2

## 2015-06-27 MED ORDER — METOPROLOL TARTRATE 1 MG/ML IV SOLN
5.0000 mg | Freq: Four times a day (QID) | INTRAVENOUS | Status: DC | PRN
  Administered 2015-06-30: 5 mg via INTRAVENOUS
  Filled 2015-06-27: qty 5

## 2015-06-27 MED ORDER — METOPROLOL TARTRATE 25 MG PO TABS
25.0000 mg | ORAL_TABLET | Freq: Two times a day (BID) | ORAL | Status: DC
  Administered 2015-06-27 – 2015-07-01 (×9): 25 mg via ORAL
  Filled 2015-06-27 (×9): qty 1

## 2015-06-27 MED ORDER — ACETAMINOPHEN 325 MG PO TABS
650.0000 mg | ORAL_TABLET | ORAL | Status: DC | PRN
  Administered 2015-06-27: 325 mg via ORAL
  Administered 2015-06-29: 650 mg via ORAL
  Filled 2015-06-27 (×2): qty 2

## 2015-06-27 MED ORDER — OXYCODONE HCL 5 MG PO TABS
5.0000 mg | ORAL_TABLET | ORAL | Status: DC | PRN
  Administered 2015-06-28 – 2015-06-29 (×5): 5 mg via ORAL
  Filled 2015-06-27 (×5): qty 1

## 2015-06-27 MED ORDER — POTASSIUM CHLORIDE 10 MEQ/100ML IV SOLN
10.0000 meq | INTRAVENOUS | Status: AC
  Administered 2015-06-27 (×4): 10 meq via INTRAVENOUS
  Filled 2015-06-27 (×4): qty 100

## 2015-06-27 NOTE — Consult Note (Signed)
CC:  Chief Complaint  Patient presents with  . Fall    HPI: Steven Duncan is a 82 y.o. male s/p fall from standing while in shower yesterday.  + LOC.  No neurological complaints aside from headache.  Complains of being hungry at this time.  PMH: No past medical history on file.  PSH: No past surgical history on file.  SH: Social History  Substance Use Topics  . Smoking status: Not on file  . Smokeless tobacco: Not on file  . Alcohol Use: Not on file    MEDS: Prior to Admission medications   Medication Sig Start Date End Date Taking? Authorizing Provider  amitriptyline (ELAVIL) 10 MG tablet Take 10 mg by mouth at bedtime.   Yes Historical Provider, MD  aspirin 81 MG tablet Take 81 mg by mouth daily.   Yes Historical Provider, MD  citalopram (CELEXA) 10 MG tablet Take 10 mg by mouth daily.   Yes Historical Provider, MD  Fish Oil-Cholecalciferol (FISH OIL + D3 PO) Take by mouth.   Yes Historical Provider, MD  glucosamine-chondroitin 500-400 MG tablet Take 1 tablet by mouth 3 (three) times daily.   Yes Historical Provider, MD  glucose blood test strip 1 each by Other route as needed for other. Use as instructed   Yes Historical Provider, MD  hydrochlorothiazide (HYDRODIURIL) 25 MG tablet Take 25 mg by mouth daily.   Yes Historical Provider, MD  Lancets MISC by Does not apply route.   Yes Historical Provider, MD  lisinopril (PRINIVIL,ZESTRIL) 40 MG tablet Take 40 mg by mouth daily.   Yes Historical Provider, MD  metFORMIN (GLUCOPHAGE) 500 MG tablet Take by mouth 2 (two) times daily with a meal.   Yes Historical Provider, MD  simvastatin (ZOCOR) 40 MG tablet Take 40 mg by mouth every evening.    Yes Historical Provider, MD    ALLERGY: No Known Allergies  ROS: ROS  Mild headache.  Hungry.  Was nauseated earlier.  Denies weakness or numbness.  NEUROLOGIC EXAM: Awake, alert, oriented Memory and concentration grossly intact Speech fluent, appropriate CN grossly intact Motor  exam: Upper Extremities Deltoid Bicep Tricep Grip  Right 5/5 5/5 5/5 5/5  Left 5/5 5/5 5/5 5/5   Lower Extremity IP Quad PF DF EHL  Right 5/5 5/5 5/5 5/5 5/5  Left 5/5 5/5 5/5 5/5 5/5   Sensation grossly intact to LT  IMAGING: Small falcine and tentorial subdural hematoma.  IMPRESSION: - 82 y.o. male with mild traumatic brain injury.  Small falcine and tentorial subdural hematomas.  No role for surgical intervention.  PLAN: - Ok to d/c when cleared by therapy - Follow up with me in 3-4 weeks with non-contrast head CT - No need to repeat CT this admission

## 2015-06-27 NOTE — Progress Notes (Signed)
MD notified Patient converted from Normal Sinus Rhythm to Atrial fibulation

## 2015-06-27 NOTE — Progress Notes (Signed)
Subjective: C/o hunger.  No other complaints.  Had some atrial fibrillation last night.  Headache improved.    Objective: Vital signs in last 24 hours: Temp:  [97.3 F (36.3 C)-98.1 F (36.7 C)] 97.8 F (36.6 C) (04/01 0329) Pulse Rate:  [72-91] 72 (04/01 0329) Resp:  [11-20] 15 (04/01 0329) BP: (113-195)/(82-121) 113/82 mmHg (04/01 0329) SpO2:  [91 %-100 %] 92 % (04/01 0329) Weight:  [70 kg (154 lb 5.2 oz)] 70 kg (154 lb 5.2 oz) (03/31 2245)    Intake/Output from previous day: 03/31 0701 - 04/01 0700 In: 450 [I.V.:450] Out: 750 [Urine:750] Intake/Output this shift:    General appearance: alert, cooperative and no distress Eyes: PERRL.  Left lateral orbit with small laceration Resp: clear to auscultation bilaterally Cardio: irregularly irregular rhythm GI: soft, non-tender; bowel sounds normal; no masses,  no organomegaly Extremities: extremities normal, atraumatic, no cyanosis or edema Neuro - very hard of hearing.    Lab Results:   Recent Labs  06/26/15 1525 06/27/15 0300  WBC 4.9 5.8  HGB 12.5* 12.1*  HCT 36.3* 36.0*  PLT 176 175   BMET  Recent Labs  06/26/15 1525 06/27/15 0300  NA 132* 127*  K 4.0 3.0*  CL 95* 92*  CO2 30 26  GLUCOSE 136* 181*  BUN 13 6  CREATININE 0.86 0.80  CALCIUM 9.3 8.5*   PT/INR  Recent Labs  06/26/15 1525  LABPROT 12.6  INR 0.92   ABG No results for input(s): PHART, HCO3 in the last 72 hours.  Invalid input(s): PCO2, PO2  Studies/Results: Ct Head Wo Contrast  06/26/2015  CLINICAL DATA:  Posttraumatic headache and head laceration and left-sided neck pain after fall in shower today. Positive loss of consciousness. EXAM: CT HEAD WITHOUT CONTRAST CT CERVICAL SPINE WITHOUT CONTRAST TECHNIQUE: Multidetector CT imaging of the head and cervical spine was performed following the standard protocol without intravenous contrast. Multiplanar CT image reconstructions of the cervical spine were also generated. COMPARISON:  None.  FINDINGS: CT HEAD FINDINGS Bony calvarium appears intact. Mild left parasagittal subdural hematoma is noted. Mild right tentorial subdural hematoma is noted as well. Ventricular size is within normal limits. No midline shift is noted. Mild diffuse cortical atrophy is noted. There is no evidence of acute infarction or mass lesion. Mild left occipital scalp hematoma is noted. CT CERVICAL SPINE FINDINGS Mild grade 1 anterolisthesis of C4-5 is noted. Patient is status post left hemilaminectomy at C5 and C6. Small defect is seen involving the posterior arc of C1 which most likely represents old fracture. No definite acute fracture is noted. There is noted and old fracture involving the superior tip of the odontoid process with corticated margins. Visualized lung apices appear normal. Severe degenerative disc disease is noted at C4-5, C5-6, C6-7 and C7-T1. IMPRESSION: Mild diffuse cortical atrophy. Mild left parasagittal and right tentorial subdural hematomas are noted. No significant midline shift is noted. Extensive degenerative and postsurgical changes are noted. Probable old odontoid and C1 posterior arch fractures are noted. No definite evidence of acute fracture is noted. Critical Value/emergent results were called by telephone at the time of interpretation on 06/26/2015 at 5:05 pm to Dr. Doug SouSAM JACUBOWITZ , who verbally acknowledged these results. Electronically Signed   By: Lupita RaiderJames  Green Jr, M.D.   On: 06/26/2015 17:05   Ct Cervical Spine Wo Contrast  06/26/2015  CLINICAL DATA:  Posttraumatic headache and head laceration and left-sided neck pain after fall in shower today. Positive loss of consciousness. EXAM: CT HEAD WITHOUT  CONTRAST CT CERVICAL SPINE WITHOUT CONTRAST TECHNIQUE: Multidetector CT imaging of the head and cervical spine was performed following the standard protocol without intravenous contrast. Multiplanar CT image reconstructions of the cervical spine were also generated. COMPARISON:  None. FINDINGS:  CT HEAD FINDINGS Bony calvarium appears intact. Mild left parasagittal subdural hematoma is noted. Mild right tentorial subdural hematoma is noted as well. Ventricular size is within normal limits. No midline shift is noted. Mild diffuse cortical atrophy is noted. There is no evidence of acute infarction or mass lesion. Mild left occipital scalp hematoma is noted. CT CERVICAL SPINE FINDINGS Mild grade 1 anterolisthesis of C4-5 is noted. Patient is status post left hemilaminectomy at C5 and C6. Small defect is seen involving the posterior arc of C1 which most likely represents old fracture. No definite acute fracture is noted. There is noted and old fracture involving the superior tip of the odontoid process with corticated margins. Visualized lung apices appear normal. Severe degenerative disc disease is noted at C4-5, C5-6, C6-7 and C7-T1. IMPRESSION: Mild diffuse cortical atrophy. Mild left parasagittal and right tentorial subdural hematomas are noted. No significant midline shift is noted. Extensive degenerative and postsurgical changes are noted. Probable old odontoid and C1 posterior arch fractures are noted. No definite evidence of acute fracture is noted. Critical Value/emergent results were called by telephone at the time of interpretation on 06/26/2015 at 5:05 pm to Dr. Doug Sou , who verbally acknowledged these results. Electronically Signed   By: Lupita Raider, M.D.   On: 06/26/2015 17:05    Anti-infectives: Anti-infectives    None      Assessment/Plan: s/p Fall wtih SDH DM type 2- metformin and SSI.   HTN- HCTZ Atrial fibrillation- suspect related to hypokalemia and relative hypomagnesemia.  Will place on beta blockade.   Hypokalemia - replete  Hyponatremia/hypochloremia- NS.  Recheck in AM.    Almond Lint 06/27/2015

## 2015-06-28 LAB — BASIC METABOLIC PANEL
ANION GAP: 5 (ref 5–15)
BUN: 11 mg/dL (ref 6–20)
CHLORIDE: 97 mmol/L — AB (ref 101–111)
CO2: 27 mmol/L (ref 22–32)
Calcium: 8.1 mg/dL — ABNORMAL LOW (ref 8.9–10.3)
Creatinine, Ser: 0.82 mg/dL (ref 0.61–1.24)
GFR calc Af Amer: >60 mL/min (ref >60)
GFR calc non Af Amer: >60 mL/min (ref >60)
GLUCOSE: 163 mg/dL — AB (ref 65–99)
POTASSIUM: 3.9 mmol/L (ref 3.5–5.1)
Sodium: 129 mmol/L — ABNORMAL LOW (ref 135–145)

## 2015-06-28 LAB — GLUCOSE, CAPILLARY
GLUCOSE-CAPILLARY: 101 mg/dL — AB (ref 65–99)
Glucose-Capillary: 128 mg/dL — ABNORMAL HIGH (ref 65–99)
Glucose-Capillary: 160 mg/dL — ABNORMAL HIGH (ref 65–99)
Glucose-Capillary: 127 mg/dL — ABNORMAL HIGH (ref 65–99)

## 2015-06-28 LAB — MAGNESIUM: Magnesium: 1.9 mg/dL (ref 1.7–2.4)

## 2015-06-28 NOTE — Progress Notes (Signed)
Report called to Jessica RN on 5W. 

## 2015-06-28 NOTE — Progress Notes (Signed)
Subjective: Doing ok neck sore   Objective: Vital signs in last 24 hours: Temp:  [98.1 F (36.7 C)-98.6 F (37 C)] 98.2 F (36.8 C) (04/02 0311) Pulse Rate:  [62-67] 65 (04/02 0808) Resp:  [17-21] 20 (04/02 0311) BP: (112-186)/(59-96) 186/96 mmHg (04/02 0808) SpO2:  [94 %-97 %] 97 % (04/02 0311) Last BM Date: 06/26/15  Intake/Output from previous day: 04/01 0701 - 04/02 0700 In: 2087.1 [P.O.:120; I.V.:1717.1; IV Piggyback:250] Out: 1000 [Urine:1000] Intake/Output this shift:    General appearance: alert and cooperative Neck: sore bruised posterior neck ROM normal  Resp: clear to auscultation bilaterally Cardio: a fib rate controlled  Neurologic: Grossly normal  Lab Results:   Recent Labs  06/26/15 1525 06/27/15 0300  WBC 4.9 5.8  HGB 12.5* 12.1*  HCT 36.3* 36.0*  PLT 176 175   BMET  Recent Labs  06/27/15 0300 06/28/15 0430  NA 127* 129*  K 3.0* 3.9  CL 92* 97*  CO2 26 27  GLUCOSE 181* 163*  BUN 6 11  CREATININE 0.80 0.82  CALCIUM 8.5* 8.1*   PT/INR  Recent Labs  06/26/15 1525  LABPROT 12.6  INR 0.92   ABG No results for input(s): PHART, HCO3 in the last 72 hours.  Invalid input(s): PCO2, PO2  Studies/Results: Ct Head Wo Contrast  06/26/2015  CLINICAL DATA:  Posttraumatic headache and head laceration and left-sided neck pain after fall in shower today. Positive loss of consciousness. EXAM: CT HEAD WITHOUT CONTRAST CT CERVICAL SPINE WITHOUT CONTRAST TECHNIQUE: Multidetector CT imaging of the head and cervical spine was performed following the standard protocol without intravenous contrast. Multiplanar CT image reconstructions of the cervical spine were also generated. COMPARISON:  None. FINDINGS: CT HEAD FINDINGS Bony calvarium appears intact. Mild left parasagittal subdural hematoma is noted. Mild right tentorial subdural hematoma is noted as well. Ventricular size is within normal limits. No midline shift is noted. Mild diffuse cortical atrophy  is noted. There is no evidence of acute infarction or mass lesion. Mild left occipital scalp hematoma is noted. CT CERVICAL SPINE FINDINGS Mild grade 1 anterolisthesis of C4-5 is noted. Patient is status post left hemilaminectomy at C5 and C6. Small defect is seen involving the posterior arc of C1 which most likely represents old fracture. No definite acute fracture is noted. There is noted and old fracture involving the superior tip of the odontoid process with corticated margins. Visualized lung apices appear normal. Severe degenerative disc disease is noted at C4-5, C5-6, C6-7 and C7-T1. IMPRESSION: Mild diffuse cortical atrophy. Mild left parasagittal and right tentorial subdural hematomas are noted. No significant midline shift is noted. Extensive degenerative and postsurgical changes are noted. Probable old odontoid and C1 posterior arch fractures are noted. No definite evidence of acute fracture is noted. Critical Value/emergent results were called by telephone at the time of interpretation on 06/26/2015 at 5:05 pm to Dr. Doug SouSAM JACUBOWITZ , who verbally acknowledged these results. Electronically Signed   By: Lupita RaiderJames  Green Jr, M.D.   On: 06/26/2015 17:05   Ct Cervical Spine Wo Contrast  06/26/2015  CLINICAL DATA:  Posttraumatic headache and head laceration and left-sided neck pain after fall in shower today. Positive loss of consciousness. EXAM: CT HEAD WITHOUT CONTRAST CT CERVICAL SPINE WITHOUT CONTRAST TECHNIQUE: Multidetector CT imaging of the head and cervical spine was performed following the standard protocol without intravenous contrast. Multiplanar CT image reconstructions of the cervical spine were also generated. COMPARISON:  None. FINDINGS: CT HEAD FINDINGS Bony calvarium appears intact. Mild left  parasagittal subdural hematoma is noted. Mild right tentorial subdural hematoma is noted as well. Ventricular size is within normal limits. No midline shift is noted. Mild diffuse cortical atrophy is noted.  There is no evidence of acute infarction or mass lesion. Mild left occipital scalp hematoma is noted. CT CERVICAL SPINE FINDINGS Mild grade 1 anterolisthesis of C4-5 is noted. Patient is status post left hemilaminectomy at C5 and C6. Small defect is seen involving the posterior arc of C1 which most likely represents old fracture. No definite acute fracture is noted. There is noted and old fracture involving the superior tip of the odontoid process with corticated margins. Visualized lung apices appear normal. Severe degenerative disc disease is noted at C4-5, C5-6, C6-7 and C7-T1. IMPRESSION: Mild diffuse cortical atrophy. Mild left parasagittal and right tentorial subdural hematomas are noted. No significant midline shift is noted. Extensive degenerative and postsurgical changes are noted. Probable old odontoid and C1 posterior arch fractures are noted. No definite evidence of acute fracture is noted. Critical Value/emergent results were called by telephone at the time of interpretation on 06/26/2015 at 5:05 pm to Dr. Doug Sou , who verbally acknowledged these results. Electronically Signed   By: Lupita Raider, M.D.   On: 06/26/2015 17:05    Anti-infectives: Anti-infectives    None      Assessment/Plan: Patient Active Problem List   Diagnosis Date Noted  . SDH (subdural hematoma) (HCC) 06/26/2015  hyponatremia continue to correct and monitor  To floor May need PT/OT unstable on his feet  A fib rate controlled looks like SR currently on betablockade   LOS: 2 days    Steven Duncan A. 06/28/2015

## 2015-06-28 NOTE — Progress Notes (Addendum)
Pt transferred to 5w16 from 3S. Daughter at bedside. Pt A&Ox4. Pt has abrasion and bruising around left eye and bruise around left ear. Will continue to monitor pt. Nelda MarseilleJenny Thacker, RN

## 2015-06-28 NOTE — Progress Notes (Signed)
Report recieved 

## 2015-06-29 DIAGNOSIS — E119 Type 2 diabetes mellitus without complications: Secondary | ICD-10-CM

## 2015-06-29 DIAGNOSIS — H9193 Unspecified hearing loss, bilateral: Secondary | ICD-10-CM

## 2015-06-29 DIAGNOSIS — E871 Hypo-osmolality and hyponatremia: Secondary | ICD-10-CM

## 2015-06-29 DIAGNOSIS — I10 Essential (primary) hypertension: Secondary | ICD-10-CM

## 2015-06-29 DIAGNOSIS — S065X9A Traumatic subdural hemorrhage with loss of consciousness of unspecified duration, initial encounter: Secondary | ICD-10-CM | POA: Insufficient documentation

## 2015-06-29 DIAGNOSIS — F068 Other specified mental disorders due to known physiological condition: Secondary | ICD-10-CM

## 2015-06-29 DIAGNOSIS — S065XAA Traumatic subdural hemorrhage with loss of consciousness status unknown, initial encounter: Secondary | ICD-10-CM | POA: Insufficient documentation

## 2015-06-29 DIAGNOSIS — S069X0S Unspecified intracranial injury without loss of consciousness, sequela: Secondary | ICD-10-CM

## 2015-06-29 DIAGNOSIS — D62 Acute posthemorrhagic anemia: Secondary | ICD-10-CM

## 2015-06-29 DIAGNOSIS — I6201 Nontraumatic acute subdural hemorrhage: Secondary | ICD-10-CM

## 2015-06-29 DIAGNOSIS — H9190 Unspecified hearing loss, unspecified ear: Secondary | ICD-10-CM | POA: Insufficient documentation

## 2015-06-29 LAB — COMPREHENSIVE METABOLIC PANEL
ALT: 16 U/L — ABNORMAL LOW (ref 17–63)
AST: 22 U/L (ref 15–41)
Albumin: 3.2 g/dL — ABNORMAL LOW (ref 3.5–5.0)
Alkaline Phosphatase: 36 U/L — ABNORMAL LOW (ref 38–126)
Anion gap: 9 (ref 5–15)
BILIRUBIN TOTAL: 1.1 mg/dL (ref 0.3–1.2)
BUN: 9 mg/dL (ref 6–20)
CHLORIDE: 89 mmol/L — AB (ref 101–111)
CO2: 28 mmol/L (ref 22–32)
CREATININE: 0.72 mg/dL (ref 0.61–1.24)
Calcium: 8.2 mg/dL — ABNORMAL LOW (ref 8.9–10.3)
Glucose, Bld: 127 mg/dL — ABNORMAL HIGH (ref 65–99)
POTASSIUM: 3.5 mmol/L (ref 3.5–5.1)
Sodium: 126 mmol/L — ABNORMAL LOW (ref 135–145)
TOTAL PROTEIN: 6.1 g/dL — AB (ref 6.5–8.1)

## 2015-06-29 LAB — GLUCOSE, CAPILLARY
GLUCOSE-CAPILLARY: 131 mg/dL — AB (ref 65–99)
GLUCOSE-CAPILLARY: 112 mg/dL — AB (ref 65–99)
GLUCOSE-CAPILLARY: 120 mg/dL — AB (ref 65–99)
Glucose-Capillary: 147 mg/dL — ABNORMAL HIGH (ref 65–99)

## 2015-06-29 MED ORDER — TRAMADOL HCL 50 MG PO TABS
50.0000 mg | ORAL_TABLET | Freq: Four times a day (QID) | ORAL | Status: DC | PRN
  Administered 2015-06-29 – 2015-07-01 (×3): 50 mg via ORAL
  Filled 2015-06-29: qty 2
  Filled 2015-06-29 (×3): qty 1

## 2015-06-29 MED ORDER — HYDROMORPHONE HCL 1 MG/ML IJ SOLN
0.5000 mg | INTRAMUSCULAR | Status: DC | PRN
  Administered 2015-06-30 (×2): 0.5 mg via INTRAVENOUS
  Filled 2015-06-29 (×2): qty 1

## 2015-06-29 NOTE — Evaluation (Signed)
Occupational Therapy Evaluation Patient Details Name: Steven HaddockFrank Duncan MRN: [id number] DOB: 1929/01/03 Today's Date: 06/29/2015    History of Present Illness 82 y.o. male [facility] for fall at home with resultant SDH.  Pt with no significant PMHx listed in chart, although it appers, just based on his physical presentation that he does have some PMHx, just not documented at this time.    Clinical Impression   Pt reports being independent in ADL and driving prior to admission. He typically walks with a cane.  Pt currently requires moderate assist for mobility and min to max assist for ADL. Will follow acutely. Recommending inpatient rehab prior to return home.   Follow Up Recommendations  CIR;Supervision/Assistance - 24 hour    Equipment Recommendations       Recommendations for Other Services       Precautions / Restrictions Precautions Precautions: Fall Precaution Comments: significant fall risk, very HOH Restrictions Weight Bearing Restrictions: No      Mobility Bed Mobility Overal bed mobility: Needs Assistance Bed Mobility: Sit to Supine;Supine to Sit     Supine to sit: Mod assist Sit to supine: Mod assist   General bed mobility comments: assist to raise trunk and for lifting LEs back into bed  Transfers Overall transfer level: Needs assistance Equipment used: Rolling walker (2 wheeled) Transfers: Sit to/from [employer]Stand;Stand Pivot Transfers Sit to Stand: Mod assist Stand pivot transfers: Mod assist       General transfer comment: verbal cues for hand placement, to rise and gain balance    Balance   Sitting-balance support: Feet supported Sitting balance-Leahy Scale: Good       Standing balance-Leahy Scale: Poor                              ADL Overall ADL's : Needs assistance/impaired Eating/Feeding: Minimal assistance;Sitting   Grooming: Wash/dry hands;Wash/dry face;Min guard;Sitting   Upper Body Bathing: Minimal  assitance;Sitting   Lower Body Bathing: Maximal assistance;Sit to/from stand   Upper Body Dressing : Minimal assistance;Sitting   Lower Body Dressing: Maximal assistance;Sit to/from stand   Toilet Transfer: Moderate assistance;Stand-pivot;BSC   Toileting- Clothing Manipulation and Hygiene: Maximal assistance;Sit to/from stand       Functional mobility during ADLs: Moderate assistance;Rolling walker       Vision     Perception     Praxis      Pertinent Vitals/Pain Pain Assessment: Faces Faces Pain Scale: Hurts little more Pain Location: neck Pain Descriptors / Indicators: Aching Pain Intervention(s): Monitored during session;Repositioned     Hand Dominance Right   Extremity/Trunk Assessment Upper Extremity Assessment Upper Extremity Assessment: RUE deficits/detail;LUE deficits/detail RUE Deficits / Details: generalized weakness, arthritic changes in hand, reports R side is his bad side, has a hx of TBI RUE Coordination: decreased fine motor LUE Deficits / Details: generalized weakness, arthritic changes in hand LUE Coordination: decreased fine motor   Lower Extremity Assessment Lower Extremity Assessment: Defer to PT evaluation   Cervical / Trunk Assessment Cervical / Trunk Assessment: Other exceptions   Communication Communication Communication: HOH   Cognition Arousal/Alertness: Awake/alert Behavior During Therapy: WFL for tasks assessed/performed Overall Cognitive Status: Impaired/Different from baseline Area of Impairment: Memory     Memory: Decreased short-term memory         General Comments: Pt difficulty keeping his train of thought during conversation. Recalls events following his fall and looking for his deceased wife.   General Comments  Exercises       Shoulder Instructions      Home Living Family/patient expects to be discharged to:: Private residence Living Arrangements: Children;Other relatives (daughter and grandson, son  in law works out of town) Available Help at Discharge: Family;Available PRN/intermittently (daughter and son in law work, 37 year old grandson does not) Type of Home: House Home Access: Stairs to enter [employer] of Steps: 6 Entrance Stairs-Rails: Right;Left;Can reach both Home Layout: Two level;Able to live on main level with bedroom/bathroom     Bathroom Shower/Tub: Producer, television/film/video: Standard     Home Equipment: Cane - single point;Shower seat;Hand held shower head;Grab bars - tub/shower   Additional Comments: Difficult hx as pt is very HOH vs has some mild cognitive deficits      Prior Functioning/Environment Level of Independence: Independent with assistive device(s)        Comments: uses cane for gait, reports he drives, daughter does IADL    OT Diagnosis: Generalized weakness;Acute pain;Cognitive deficits   OT Problem List: Decreased strength;Decreased activity tolerance;Impaired balance (sitting and/or standing);Decreased coordination;Decreased cognition;Decreased knowledge of use of DME or AE;Decreased safety awareness;Impaired UE functional use;Pain;Decreased range of motion   OT Treatment/Interventions: Self-care/ADL training;DME and/or AE instruction;Therapeutic activities;Cognitive remediation/compensation;Patient/family education;Balance training    OT Goals(Current goals can be found in the care plan section) Acute Rehab OT Goals Patient Stated Goal: pt wants to get more steady on his feet OT Goal Formulation: With patient Time For Goal Achievement: 07/13/15 Potential to Achieve Goals: Good ADL Goals Pt Will Perform Grooming: with supervision;standing Pt Will Perform Upper Body Bathing: with supervision;sitting Pt Will Perform Lower Body Bathing: with supervision;sit to/from stand Pt Will Perform Upper Body Dressing: with supervision;sitting Pt Will Perform Lower Body Dressing: with supervision;sit to/from stand Pt Will Transfer to  Toilet: with supervision;ambulating;bedside commode (over toilet) Pt Will Perform Toileting - Clothing Manipulation and hygiene: with supervision;sit to/from stand Pt Will Perform Tub/Shower Transfer: Shower transfer;with supervision;ambulating;shower seat;grab bars;rolling walker  OT Frequency: Min 2X/week   Barriers to D/C:            Co-evaluation              End of Session Equipment Utilized During Treatment: Gait belt;Rolling walker  Activity Tolerance: Patient tolerated treatment well Patient left: in bed;with call bell/phone within reach;with bed alarm set   Time: 1520-1543 OT Time Calculation (min): 23 min Charges:  OT General Charges $OT Visit: 1 Procedure OT Evaluation $OT Eval Moderate Complexity: 1 Procedure OT Treatments $Self Care/Home Management : 8-22 mins G-Codes:    Evern Bio 06/29/2015, 4:00 PM  [phone]

## 2015-06-29 NOTE — Consult Note (Signed)
Physical Medicine and Rehabilitation Consult Reason for Consult: Traumatic subdural hematoma Referring Physician: Trauma   HPI: Steven Duncan is a 82 y.o. right handed male with history of hypertension, diabetes mellitus, extremely hard of hearing and question cognitive deficits. Patient was with daughter son-in-law and grandchild. Used a single-point cane prior to admission. Admitted 06/26/2015 after mechanical fall while in the shower. Positive loss of consciousness. Complains of headache. CT scan imaging revealed small falcine and tentorial subdural hematoma. No significant midline shift. CT cervical spine negative for fracture. Neurosurgery consulted and advised conservative care. Tolerating a regular consistency diet. Physical therapy evaluation completed 06/29/2015 with recommendations of physical medicine rehabilitation consult.   Review of Systems  Constitutional: Negative for fever and chills.  HENT: Positive for hearing loss.   Eyes: Negative for blurred vision and double vision.  Respiratory: Negative for cough and shortness of breath.   Cardiovascular: Negative for chest pain, palpitations and leg swelling.  Gastrointestinal: Positive for constipation. Negative for nausea and vomiting.  Genitourinary: Positive for urgency. Negative for dysuria and hematuria.  Musculoskeletal: Positive for myalgias and joint pain.  Skin: Negative for rash.  Neurological: Positive for loss of consciousness, weakness and headaches. Negative for seizures.  Psychiatric/Behavioral: Positive for depression and memory loss.  All other systems reviewed and are negative.  No past medical history on file. No past surgical history on file. No family history on file. Social History:  has no tobacco, alcohol, and drug history on file. Allergies: No Known Allergies Medications Prior to Admission  Medication Sig Dispense Refill  . amitriptyline (ELAVIL) 10 MG tablet Take 10 mg by mouth at bedtime.     Marland Kitchen aspirin 81 MG tablet Take 81 mg by mouth daily.    . citalopram (CELEXA) 10 MG tablet Take 10 mg by mouth daily.    . Fish Oil-Cholecalciferol (FISH OIL + D3 PO) Take by mouth.    Marland Kitchen glucosamine-chondroitin 500-400 MG tablet Take 1 tablet by mouth 3 (three) times daily.    Marland Kitchen glucose blood test strip 1 each by Other route as needed for other. Use as instructed    . hydrochlorothiazide (HYDRODIURIL) 25 MG tablet Take 25 mg by mouth daily.    . Lancets MISC by Does not apply route.    Marland Kitchen lisinopril (PRINIVIL,ZESTRIL) 40 MG tablet Take 40 mg by mouth daily.    . metFORMIN (GLUCOPHAGE) 500 MG tablet Take by mouth 2 (two) times daily with a meal.    . simvastatin (ZOCOR) 40 MG tablet Take 40 mg by mouth every evening.       Home: Home Living Family/patient expects to be discharged to:: Private residence Living Arrangements: Children, Other relatives (daughter, son in law and grandchild) Available Help at Discharge: Family, Available PRN/intermittently (family works) Type of Home: House Home Equipment: Gilmer Mor - single point Additional Comments: Difficult hx as pt is very HOH vs has some mild cognitive deficits  Functional History: Prior Function Level of Independence: Independent with assistive device(s) Comments: uses cane for gait Functional Status:  Mobility: Bed Mobility Overal bed mobility: Needs Assistance Bed Mobility: Sit to Supine Sit to supine: Mod assist General bed mobility comments: Mod assist to lift both legs up into the bed when going to supine Transfers Overall transfer level: Needs assistance Equipment used: Rolling walker (2 wheeled), Straight cane Transfers: Sit to/from Stand Sit to Stand: Mod assist General transfer comment: Mod assist to support trunk during transition to stand.  Started standing with cane and  quickly realized that this would not be enough support for him during gait and switched to a RW instead.  Verbal cues for safe hand placement.    Ambulation/Gait Ambulation/Gait assistance: Mod assist Ambulation Distance (Feet): 70 Feet Assistive device: Rolling walker (2 wheeled) Gait Pattern/deviations: Step-through pattern, Steppage, Trunk flexed General Gait Details: Pt with right leg steppage gait pattern and right hand slipping off of RW during gait (maybe PMHx of stroke--nothing listed in the chart).  I attempted to ask pt about his gait pattern, but due to significant hearing deficits could not drill down to the cause of his right leg steppage gait pattern.  Pt fatigued very quickly and needed mod assist for balance and to help steer RW  Gait velocity: decreased Gait velocity interpretation: <1.8 ft/sec, indicative of risk for recurrent falls    ADL:    Cognition: Cognition Overall Cognitive Status: Impaired/Different from baseline Orientation Level: Oriented X4 Cognition Arousal/Alertness: Awake/alert Behavior During Therapy: WFL for tasks assessed/performed Overall Cognitive Status: Impaired/Different from baseline Area of Impairment: Memory Memory: Decreased short-term memory General Comments: Pt reports when he first woke up after hitting his head that he was looking for his wife (who is deceased).  He often at times interchanged speaking of his wife/daughter during our conversations.  Difficult to assess due to: Hard of hearing/deaf  Blood pressure 165/78, pulse 64, temperature 98.1 F (36.7 C), temperature source Oral, resp. rate 16, height  (1.651 m), weight 70 kg (154 lb 5.2 oz), SpO2 99 %. Physical Exam  Vitals reviewed. Constitutional: He is oriented to person, place, and time. He appears well-developed and well-nourished.  HENT:  Head: Normocephalic.  Right Ear: External ear normal.  Left Ear: External ear normal.  Eyes: Conjunctivae and EOM are normal.  Neck: Normal range of motion. Neck supple. No thyromegaly present.  Cardiovascular: Normal rate and regular rhythm.   Respiratory: Effort normal  and breath sounds normal. No respiratory distress.  GI: Soft. Bowel sounds are normal. He exhibits no distension.  Musculoskeletal: He exhibits no edema or tenderness.  Neurological: He is alert and oriented to person, place, and time.  Alert and makes good eye contact with examiner.  Exam limited as patient is extremely hard of hearing with hearing aid in place.  He does follow simple demonstrated commands.  Motor: B/l UE: 4+/5 proximal to distal B/l LE 4+/5 proximal to distal Sensation intact to light touch DTRs 3+ left side HOH  Skin: Skin is warm and dry.  Psychiatric: His speech is normal.  Inappropriate laughter    Results for orders placed or performed during the hospital encounter of 06/26/15 (from the past 24 hour(s))  Glucose, capillary     Status: Abnormal   Collection Time: 06/28/15  5:01 PM  Result Value Ref Range   Glucose-Capillary 160 (H) 65 - 99 mg/dL   Comment 1 Notify RN    Comment 2 Document in Chart   Glucose, capillary     Status: Abnormal   Collection Time: 06/28/15 11:25 PM  Result Value Ref Range   Glucose-Capillary 127 (H) 65 - 99 mg/dL  Glucose, capillary     Status: Abnormal   Collection Time: 06/29/15  8:24 AM  Result Value Ref Range   Glucose-Capillary 131 (H) 65 - 99 mg/dL  Comprehensive metabolic panel     Status: Abnormal   Collection Time: 06/29/15 11:02 AM  Result Value Ref Range   Sodium 126 (L) 135 - 145 mmol/L   Potassium 3.5 3.5 -  5.1 mmol/L   Chloride 89 (L) 101 - 111 mmol/L   CO2 28 22 - 32 mmol/L   Glucose, Bld 127 (H) 65 - 99 mg/dL   BUN 9 6 - 20 mg/dL   Creatinine, Ser 1.61 0.61 - 1.24 mg/dL   Calcium 8.2 (L) 8.9 - 10.3 mg/dL   Total Protein 6.1 (L) 6.5 - 8.1 g/dL   Albumin 3.2 (L) 3.5 - 5.0 g/dL   AST 22 15 - 41 U/L   ALT 16 (L) 17 - 63 U/L   Alkaline Phosphatase 36 (L) 38 - 126 U/L   Total Bilirubin 1.1 0.3 - 1.2 mg/dL   GFR calc non Af Amer >60 >60 mL/min   GFR calc Af Amer >60 >60 mL/min   Anion gap 9 5 - 15  Glucose,  capillary     Status: Abnormal   Collection Time: 06/29/15 12:34 PM  Result Value Ref Range   Glucose-Capillary 112 (H) 65 - 99 mg/dL   No results found.  Assessment/Plan: Diagnosis: Traumatic subdural hematoma Labs and images independently reviewed.  Records reviewed and summated above.  [place] score:  >/7  Speech to evaluate for Post traumatic amnesia and interval GOAT scores to assess progress.  NeuroPsych evaluation for behavorial assessment.  Address behavioral concerns include providing structured environments and daily routines.  Cognitive therapy to direct modular abilities in order to maintain goals  including problem solving, self regulation/monitoring, self management, attention, and memory.  Fall precautions; pt at risk for second impact syndrome  Prevention of secondary injury: monitor for hypotension, hypoxia, seizures or signs of increased ICP  Prophylactic AED:   Consider pharmacological intervention if necessary with neurostimulants,  Such as amantadine, methylphenidate, modafinil, etc.  Avoid medications that could impair cognitive abilities, such as anticholinergics, antihistaminic, benzodiazapines, narcotics, etc when possible  1. Does the need for close, 24 hr/day medical supervision in concert with the patient's rehab needs make it unreasonable for this patient to be served in a less intensive setting? Yes  2. Co-Morbidities requiring supervision/potential complications: HTN (monitor and provide prns in accordance with increased physical exertion and pain), diabetes mellitus (Monitor in accordance with exercise and adjust meds as necessary), extremely hard of hearing (inspite of hearing aids), ?cognitive deficits (SLP eval), headache (cont to treat to ensure pain does not limit functional progress), hyponatremia (cont to monitor, consider repletion if necessary), ABLA (transfuse if necessary to ensure appropriate perfusion for increased activity  tolerance) 3. Due to bladder management, safety, skin/wound care, disease management, medication administration and patient education, does the patient require 24 hr/day rehab nursing? Yes 4. Does the patient require coordinated care of a physician, rehab nurse, PT (1-2 hrs/day, 5 days/week), OT (1-2 hrs/day, 5 days/week) and SLP (1-2 hrs/day, 5 days/week) to address physical and functional deficits in the context of the above medical diagnosis(es)? Yes Addressing deficits in the following areas: balance, endurance, locomotion, strength, transferring, bowel/bladder control, bathing, toileting, cognition and psychosocial support 5. Can the patient actively participate in an intensive therapy program of at least 3 hrs of therapy per day at least 5 days per week? Yes 6. The potential for patient to make measurable gains while on inpatient rehab is excellent 7. Anticipated functional outcomes upon discharge from inpatient rehab are supervision  with PT, supervision with OT, supervision and min assist with SLP. 8. Estimated rehab length of stay to reach the above functional goals is: 13-17 days. 9. Does the patient have adequate social supports and living environment to  accommodate these discharge functional goals? Yes 10. Anticipated D/C setting: Home 11. Anticipated post D/C treatments: HH therapy and Home excercise program 12. Overall Rehab/Functional Prognosis: excellent  RECOMMENDATIONS: This patient's condition is appropriate for continued rehabilitative care in the following setting: CIR when medically stable Patient has agreed to participate in recommended program. Yes Note that insurance prior authorization may be required for reimbursement for recommended care.  Comment: Rehab Admissions Coordinator to follow up.  Maryla MorrowAnkit Jamario Colina, MD 06/29/2015

## 2015-06-29 NOTE — Evaluation (Signed)
Physical Therapy Evaluation Patient Details Name: Steven Duncan MRN: [id number] DOB: October 04, 1928 Today's Date: 06/29/2015   History of Present Illness  82 y.o. male admitted to [facility] on 06/26/15 for fall at home with resultant SDH.  Pt with no significant PMHx listed in chart, although it appers, just based on his physical presentation that he does have some PMHx, just not documented at this time.   Clinical Impression  Pt is very unsteady on his feet, requiring up to mod assist for gait with RW.  Per pt report, he normally uses a cane at baseline, but there was no way to walk him with only a cane today. History and cognitive assessment were difficult due to pt is VERY HOH.  He would benefit from extensive rehab before he d/c home with his family.   PT to follow acutely for deficits listed below.       Follow Up Recommendations CIR    Equipment Recommendations  Rolling walker with 5" wheels    Recommendations for Other Services Rehab consult     Precautions / Restrictions Precautions Precautions: Fall Precaution Comments: significant fall risk Restrictions Weight Bearing Restrictions: No      Mobility  Bed Mobility Overal bed mobility: Needs Assistance Bed Mobility: Sit to Supine       Sit to supine: Mod assist   General bed mobility comments: Mod assist to lift both legs up into the bed when going to supine  Transfers Overall transfer level: Needs assistance Equipment used: Rolling walker (2 wheeled);Straight cane Transfers: Sit to/from Stand Sit to Stand: Mod assist         General transfer comment: Mod assist to support trunk during transition to stand.  Started standing with cane and quickly realized that this would not be enough support for him during gait and switched to a RW instead.  Verbal cues for safe hand placement.   Ambulation/Gait Ambulation/Gait assistance: Mod assist Ambulation Distance (Feet): 70 Feet Assistive device: Rolling walker (2 wheeled) Gait  Pattern/deviations: Step-through pattern;Steppage;Trunk flexed Gait velocity: decreased Gait velocity interpretation: <1.8 ft/sec, indicative of risk for recurrent falls General Gait Details: Pt with right leg steppage gait pattern and right hand slipping off of RW during gait (maybe PMHx of stroke--nothing listed in the chart).  I attempted to ask pt about his gait pattern, but due to significant hearing deficits could not drill down to the cause of his right leg steppage gait pattern.  Pt fatigued very quickly and needed mod assist for balance and to help steer RW       Balance Overall balance assessment: Needs assistance Sitting-balance support: Feet supported;Bilateral upper extremity supported Sitting balance-Leahy Scale: Good   Postural control: Posterior lean Standing balance support: Bilateral upper extremity supported Standing balance-Leahy Scale: Poor Standing balance comment: pt, when he stands up tall and looks up has a posterior preference in standing.  He needs the support of an assistive device AND the therapist for balance in standing.                              Pertinent Vitals/Pain Pain Assessment: Faces Faces Pain Scale: Hurts little more Pain Location: neck Pain Descriptors / Indicators: Sore Pain Intervention(s): Limited activity within patient's tolerance;Monitored during session;Repositioned    Home Living Family/patient expects to be discharged to:: Private residence Living Arrangements: Children;Other relatives (daughter, son in law and grandchild) Available Help at Discharge: Family;Available PRN/intermittently (family works) Type of Home: [employer]  Home Equipment: Cane - single point Additional Comments: Difficult hx as pt is very HOH vs has some mild cognitive deficits    Prior Function Level of Independence: Independent with assistive device(s)         Comments: uses cane for gait        Extremity/Trunk Assessment   Upper  Extremity Assessment: RUE deficits/detail RUE Deficits / Details: right arm seemed weaker.  He had a hard time keeping it gripped to the RW.  Pt could not report to me wheather he had PMHx of stroke (he had steppage gait pattern on the right as well), but did at one time report his right was his "bad side" when I asked which side he used the cane on he said, "left because the right is my 'bad side'"         Lower Extremity Assessment: RLE deficits/detail RLE Deficits / Details: per pt report he has right ankle problems.  He relates them to swelling (none today), but has a steppage gait pattern on this side when walking.     Cervical / Trunk Assessment: Other exceptions  Communication   Communication: HOH  Cognition Arousal/Alertness: Awake/alert Behavior During Therapy: WFL for tasks assessed/performed Overall Cognitive Status: Impaired/Different from baseline Area of Impairment: Memory     Memory: Decreased short-term memory         General Comments: Pt reports when he first woke up after hitting his head that he was looking for his wife (who is deceased).  He often at times interchanged speaking of his wife/daughter during our conversations.              Assessment/Plan    PT Assessment Patient needs continued PT services  PT Diagnosis Difficulty walking;Abnormality of gait;Generalized weakness;Acute pain;Altered mental status   PT Problem List Decreased strength;Decreased activity tolerance;Decreased balance;Decreased mobility;Decreased knowledge of use of DME;Decreased cognition;Pain  PT Treatment Interventions DME instruction;Gait training;Stair training;Functional mobility training;Therapeutic activities;Therapeutic exercise;Balance training;Neuromuscular re-education;Patient/family education;Manual techniques;Modalities   PT Goals (Current goals can be found in the Care Plan section) Acute Rehab PT Goals Patient Stated Goal: pt wants to get more steady on his feet PT  Goal Formulation: With patient Time For Goal Achievement: 06/29/15 Potential to Achieve Goals: Good    Frequency Min 3X/week   Barriers to discharge Decreased caregiver support pt is home alone during the day       End of Session Equipment Utilized During Treatment: Gait belt Activity Tolerance: Patient limited by fatigue Patient left: in bed;with call bell/phone within reach;with bed alarm set           Time: 1001-1033 PT Time Calculation (min) (ACUTE ONLY): 32 min   Charges:   PT Evaluation $PT Eval Moderate Complexity: 1 Procedure PT Treatments $Gait Training: 8-22 mins        Tehya Leath B. Xavien Dauphinais, PT, DPT [id number]#[phone]   06/29/2015, 12:19 PM

## 2015-06-29 NOTE — Progress Notes (Signed)
Patient ID: Steven HaddockFrank Duncan, male   DOB: 04-Oct-1928, 82 y.o.   MRN: [id number]   LOS: 3 days   Subjective: Feeling a little better, sore   Objective: Vital signs in last 24 hours: Temp:  [97.7 F (36.5 C)-98.2 F (36.8 C)] 98.1 F (36.7 C) (04/03 0547) Pulse Rate:  [59-66] 64 (04/03 0919) Resp:  [16] 16 (04/03 0547) BP: (147-166)/(73-89) 165/78 mmHg (04/03 0919) SpO2:  [97 %-99 %] 99 % (04/03 0547) Last BM Date: 06/26/15   Laboratory  CBC  Recent Labs  06/26/15 1525 06/27/15 0300  WBC 4.9 5.8  HGB 12.5* 12.1*  HCT 36.3* 36.0*  PLT 176 175   BMET  Recent Labs  06/27/15 0300 06/28/15 0430  NA 127* 129*  K 3.0* 3.9  CL 92* 97*  CO2 26 27  GLUCOSE 181* 163*  BUN 6 11  CREATININE 0.80 0.82  CALCIUM 8.5* 8.1*   CBG (last 3)   Recent Labs  06/28/15 1701 06/28/15 2325 06/29/15 0824  GLUCAP 160* 127* 131*    Physical Exam General appearance: alert and no distress Resp: clear to auscultation bilaterally Cardio: regular rate and rhythm GI: normal findings: bowel sounds normal and soft, non-tender   Assessment/Plan: Fall TBI -- No interventions per Dr. Bevely Palmeritty Multiple medical problems -- Home meds FEN -- SL IV, check Na+ tomorrow VTE -- SCD's Dispo -- Awaiting PT, add OT/ST    Freeman CaldronMichael J. Ajani Schnieders, PA-C Pager: [phone] General Trauma PA Pager: [phone]  06/29/2015

## 2015-06-29 NOTE — Progress Notes (Signed)
Rehab Admissions Coordinator Note:  Patient was screened by Clois DupesBoyette, Mahitha Hickling Godwin for appropriateness for an Inpatient Acute Rehab Consult per PT recommendation.  At this time, we are recommending Inpatient Rehab consult.  Clois DupesBoyette, Jenaro Souder Godwin 06/29/2015, 1:11 PM  I can be reached at [phone].

## 2015-06-29 NOTE — Care Management Important Message (Signed)
Important Message  Patient Details  Name: Steven HaddockFrank Duncan MRN: [id number] Date of Birth: 18-Jan-1929   Medicare Important Message Given:  Yes    [name] [name] 06/29/2015, 11:25 AM

## 2015-06-29 NOTE — Progress Notes (Signed)
I iwll follow up with pt and family tomorrow for possible admit to inpt rehab when medically ready. [phone]

## 2015-06-30 ENCOUNTER — Encounter (HOSPITAL_COMMUNITY): Payer: Self-pay | Admitting: Physical Medicine and Rehabilitation

## 2015-06-30 LAB — GLUCOSE, CAPILLARY
GLUCOSE-CAPILLARY: 117 mg/dL — AB (ref 65–99)
Glucose-Capillary: 124 mg/dL — ABNORMAL HIGH (ref 65–99)
Glucose-Capillary: 147 mg/dL — ABNORMAL HIGH (ref 65–99)
Glucose-Capillary: 155 mg/dL — ABNORMAL HIGH (ref 65–99)

## 2015-06-30 LAB — BASIC METABOLIC PANEL
Anion gap: 11 (ref 5–15)
BUN: 8 mg/dL (ref 6–20)
CHLORIDE: 87 mmol/L — AB (ref 101–111)
CO2: 26 mmol/L (ref 22–32)
Calcium: 8.4 mg/dL — ABNORMAL LOW (ref 8.9–10.3)
Creatinine, Ser: 0.71 mg/dL (ref 0.61–1.24)
GFR calc Af Amer: >60 mL/min (ref >60)
GFR calc non Af Amer: >60 mL/min (ref >60)
Glucose, Bld: 128 mg/dL — ABNORMAL HIGH (ref 65–99)
POTASSIUM: 3.5 mmol/L (ref 3.5–5.1)
SODIUM: 124 mmol/L — AB (ref 135–145)

## 2015-06-30 MED ORDER — SODIUM CHLORIDE 1 G PO TABS
1.0000 g | ORAL_TABLET | Freq: Three times a day (TID) | ORAL | Status: DC
  Administered 2015-06-30 – 2015-07-01 (×3): 1 g via ORAL
  Filled 2015-06-30 (×4): qty 1

## 2015-06-30 NOTE — H&P (Addendum)
Physical Medicine and Rehabilitation Admission H&P    Chief Complaint  Patient presents with  . Fall with TBI   HPI:   Steven Duncan is a 82 y.o. right handed male with history of HTN, DMT2, depression, HOH, frequent falls, cognitive deficits who was admitted on 06/26/2015 after mechanical fall while in the shower with positive LOC (out about an hour per patient) and complaints of head and neck pain. Family found patient confused and wandering around the house. CT head revealed small falcine and tentorial subdural hematoma and Dr. Cyndy Freeze felt that no surgical intervention needed. Patient to follow with  NS in 3-4 weeks for follow CCT.  CT cervical spine showed extensive degenerative C4- T1, post surgical changes C5 and C6, old odontoid and C1 posterior arch fractures. Therapy evaluations done yesterday revealing unsteady gait, confusion and poor safety awareness affecting mobility as well as ability to carry out ADL task. CIR recommended for follow up therapy.    Used to go to the [employer] till months ago. Now goes to [facility] for exercise. Falls frequently but "keeps cane near by but trying to get way from it"   Review of Systems  Unable to perform ROS: other  HENT: Positive for hearing loss.   Eyes: Negative for blurred vision and double vision.  Respiratory: Negative for shortness of breath.   Cardiovascular: Negative for chest pain.  Gastrointestinal: Negative for abdominal pain.  Musculoskeletal: Positive for myalgias and joint pain (right hip and left neck pain).  Neurological: Positive for weakness and headaches.  Psychiatric/Behavioral: Positive for memory loss (some difficulty recalling medical history).  All other systems reviewed and are negative.     Past Medical History  Diagnosis Date  . Diabetes ([place])   . Gait disorder   . HTN (hypertension)   . Dyslipidemia   . HOH (hard of hearing)   . Anemia   . Subdural hematoma, acute ([place]) 06/2015  . Hyponatremia 06/2015      Past Surgical History  Procedure Laterality Date  . Cervical spine surgery      C5 and C6 hemilaminectomy?  . Brain surgery  1951    "to relieve pressure from brain due to skull fractures"     History reviewed. No pertinent family history.    Social History:  Widowed. Moved to [place] from [place] last fall--used to winter here in the past. Retired from [employer] in [place]. Independent with cane and drives. Lives with daughter who's a school Pharmacist, hospital in [place]. He denies any tobacco, alcohol, and drug use.     Allergies: No Known Allergies    Medications Prior to Admission  Medication Sig Dispense Refill  . amitriptyline (ELAVIL) 10 MG tablet Take 10 mg by mouth at bedtime.    Marland Kitchen aspirin 81 MG tablet Take 81 mg by mouth daily.    . citalopram (CELEXA) 10 MG tablet Take 10 mg by mouth daily.    . Fish Oil-Cholecalciferol (FISH OIL + D3 PO) Take by mouth.    Marland Kitchen glucosamine-chondroitin 500-400 MG tablet Take 1 tablet by mouth 3 (three) times daily.    Marland Kitchen glucose blood test strip 1 each by Other route as needed for other. Use as instructed    . hydrochlorothiazide (HYDRODIURIL) 25 MG tablet Take 25 mg by mouth daily.    . Lancets MISC by Does not apply route.    Marland Kitchen lisinopril (PRINIVIL,ZESTRIL) 40 MG tablet Take 40 mg by mouth daily.    . metFORMIN (GLUCOPHAGE) 500 MG  tablet Take by mouth 2 (two) times daily with a meal.    . simvastatin (ZOCOR) 40 MG tablet Take 40 mg by mouth every evening.       Home: Home Living Family/patient expects to be discharged to:: Unsure Living Arrangements: Children Available Help at Discharge: Family, Available PRN/intermittently Type of Home: House Home Access: Stairs to enter [employer] of Steps: 6 Entrance Stairs-Rails: Right, Left, Can reach both Home Layout: Two level, Able to live on main level with bedroom/bathroom Bathroom Shower/Tub: Multimedia programmer: Standard Bathroom Accessibility: Yes Home Equipment: Cane -  single point, Shower seat, Hand held shower head, Grab bars - tub/shower Additional Comments: Pt very HOH, daughter clarified home layout  Lives With: Daughter (and her family since August 2016)   Functional History: Prior Function Level of Independence: Independent with assistive device(s) Comments: uses cane for gait, reports he drives, daughter does IADL  Functional Status:  Mobility: Bed Mobility Overal bed mobility:  (Pt received in recliner chair.  ) Bed Mobility: Sit to Supine, Supine to Sit Supine to sit: Mod assist Sit to supine: Mod assist General bed mobility comments: assist to raise trunk and for lifting LEs back into bed Transfers Overall transfer level: Needs assistance Equipment used: Rolling walker (2 wheeled) Transfers: Sit to/from Stand Sit to Stand: Mod assist Stand pivot transfers: Mod assist General transfer comment: Verbal and tactile cues for hand placement.  Pt required projection from PTA to follow VCs and required cues repeated.  Pt performed sit to stand from bed and commode.  Pt remains to present with poor safety and problem requiring max cues to maintain safety.   Ambulation/Gait Ambulation/Gait assistance: Mod assist Ambulation Distance (Feet): 20 Feet (+ 70 ft.  ) Assistive device: Rolling walker (2 wheeled) Gait Pattern/deviations: Step-through pattern, Steppage, Trunk flexed, Leaning posteriorly, Decreased stride length General Gait Details: Pt required cues for hand placement on RW grips, maintaining close proximity to RW and assist to turn RW.  Pt required assist to improve posture.  Pt responds better to hand gestures for directions.   Gait velocity: decreased Gait velocity interpretation: <1.8 ft/sec, indicative of risk for recurrent falls    ADL: ADL Overall ADL's : Needs assistance/impaired Eating/Feeding: Minimal assistance, Sitting Grooming: Wash/dry hands, Wash/dry face, Min guard, Sitting Upper Body Bathing: Minimal assitance,  Sitting Lower Body Bathing: Maximal assistance, Sit to/from stand Upper Body Dressing : Minimal assistance, Sitting Lower Body Dressing: Maximal assistance, Sit to/from stand Toilet Transfer: Moderate assistance, Stand-pivot, BSC Toileting- Clothing Manipulation and Hygiene: Maximal assistance, Sit to/from stand Functional mobility during ADLs: Moderate assistance, Rolling walker  Cognition: Cognition Overall Cognitive Status: History of cognitive impairments - at baseline Arousal/Alertness: Awake/alert Orientation Level: Oriented X4 Attention: Sustained, Selective Sustained Attention: Appears intact Selective Attention: Impaired Selective Attention Impairment: Functional complex, Verbal complex Memory: Impaired Memory Impairment: Retrieval deficit Awareness: Appears intact Problem Solving: Appears intact Safety/Judgment: Appears intact Cognition Arousal/Alertness: Awake/alert Behavior During Therapy: WFL for tasks assessed/performed Overall Cognitive Status: History of cognitive impairments - at baseline Area of Impairment: Memory Memory: Decreased short-term memory General Comments: Pt difficulty keeping his train of thought during conversation. Recalls events following his fall and reports being a young man.   Difficult to assess due to: Hard of hearing/deaf   Blood pressure 136/78, pulse 66, temperature 98 F (36.7 C), temperature source Oral, resp. rate 19, height '5\' 5"'  (1.651 m), weight 70 kg (154 lb 5.2 oz), SpO2 99 %. Physical Exam  Nursing note and vitals reviewed.  Constitutional: He is oriented to person, place, and time. He appears well-developed and well-nourished. No distress.  HENT:  Head: Normocephalic and atraumatic.  Mouth/Throat: Oropharynx is clear and moist.  Eyes: Conjunctivae are normal. Pupils are equal, round, and reactive to light.  Neck: Normal range of motion. Neck supple.  Well healed old posterior neck incision.  Ecchymosis and tender abraded  area on left post auricular and left neck.   Cardiovascular: Normal rate and regular rhythm.   Respiratory: Effort normal and breath sounds normal. No stridor.  GI: Soft. Bowel sounds are normal. He exhibits no distension. There is no tenderness.  Musculoskeletal: He exhibits no edema or tenderness.  Tight heel cord right ankle (due to prior injury)  Neurological: He is alert and oriented to person, place, and time.  Extremely HOH despite hearing aids.  Alert and makes good eye contact with examiner.  Exam limited as patient is extremely hard of hearing with hearing aid in place.  Motor: B/l UE: 4+/5 proximal to distal B/l LE 4+/5 proximal to distal Sensation intact to light touch DTRs 3+ left side  Skin: Skin is warm. No rash noted. He is not diaphoretic. No erythema.  Psychiatric: He has a normal mood and affect. Thought content normal.  Inappropriate laughter    Results for orders placed or performed during the hospital encounter of 06/26/15 (from the past 48 hour(s))  Comprehensive metabolic panel     Status: Abnormal   Collection Time: 06/29/15 11:02 AM  Result Value Ref Range   Sodium 126 (L) 135 - 145 mmol/L   Potassium 3.5 3.5 - 5.1 mmol/L   Chloride 89 (L) 101 - 111 mmol/L   CO2 28 22 - 32 mmol/L   Glucose, Bld 127 (H) 65 - 99 mg/dL   BUN 9 6 - 20 mg/dL   Creatinine, Ser 0.72 0.61 - 1.24 mg/dL   Calcium 8.2 (L) 8.9 - 10.3 mg/dL   Total Protein 6.1 (L) 6.5 - 8.1 g/dL   Albumin 3.2 (L) 3.5 - 5.0 g/dL   AST 22 15 - 41 U/L   ALT 16 (L) 17 - 63 U/L   Alkaline Phosphatase 36 (L) 38 - 126 U/L   Total Bilirubin 1.1 0.3 - 1.2 mg/dL   GFR calc non Af Amer >60 >60 mL/min   GFR calc Af Amer >60 >60 mL/min    Comment: (NOTE) The eGFR has been calculated using the CKD EPI equation. This calculation has not been validated in all clinical situations. eGFR's persistently <60 mL/min signify possible Chronic Kidney Disease.    Anion gap 9 5 - 15  Glucose, capillary     Status:  Abnormal   Collection Time: 06/29/15 12:34 PM  Result Value Ref Range   Glucose-Capillary 112 (H) 65 - 99 mg/dL  Glucose, capillary     Status: Abnormal   Collection Time: 06/29/15  4:56 PM  Result Value Ref Range   Glucose-Capillary 147 (H) 65 - 99 mg/dL  Glucose, capillary     Status: Abnormal   Collection Time: 06/29/15  9:17 PM  Result Value Ref Range   Glucose-Capillary 120 (H) 65 - 99 mg/dL  Basic metabolic panel     Status: Abnormal   Collection Time: 06/30/15  5:01 AM  Result Value Ref Range   Sodium 124 (L) 135 - 145 mmol/L   Potassium 3.5 3.5 - 5.1 mmol/L   Chloride 87 (L) 101 - 111 mmol/L   CO2 26 22 - 32 mmol/L   Glucose,  Bld 128 (H) 65 - 99 mg/dL   BUN 8 6 - 20 mg/dL   Creatinine, Ser 0.71 0.61 - 1.24 mg/dL   Calcium 8.4 (L) 8.9 - 10.3 mg/dL   GFR calc non Af Amer >60 >60 mL/min   GFR calc Af Amer >60 >60 mL/min    Comment: (NOTE) The eGFR has been calculated using the CKD EPI equation. This calculation has not been validated in all clinical situations. eGFR's persistently <60 mL/min signify possible Chronic Kidney Disease.    Anion gap 11 5 - 15  Glucose, capillary     Status: Abnormal   Collection Time: 06/30/15  8:03 AM  Result Value Ref Range   Glucose-Capillary 117 (H) 65 - 99 mg/dL  Glucose, capillary     Status: Abnormal   Collection Time: 06/30/15 12:11 PM  Result Value Ref Range   Glucose-Capillary 124 (H) 65 - 99 mg/dL  Glucose, capillary     Status: Abnormal   Collection Time: 06/30/15  5:09 PM  Result Value Ref Range   Glucose-Capillary 147 (H) 65 - 99 mg/dL  Glucose, capillary     Status: Abnormal   Collection Time: 06/30/15  9:37 PM  Result Value Ref Range   Glucose-Capillary 155 (H) 65 - 99 mg/dL  Basic metabolic panel     Status: Abnormal   Collection Time: 07/01/15  6:21 AM  Result Value Ref Range   Sodium 128 (L) 135 - 145 mmol/L   Potassium 4.9 3.5 - 5.1 mmol/L   Chloride 88 (L) 101 - 111 mmol/L   CO2 29 22 - 32 mmol/L   Glucose,  Bld 156 (H) 65 - 99 mg/dL   BUN 13 6 - 20 mg/dL   Creatinine, Ser 0.80 0.61 - 1.24 mg/dL   Calcium 9.0 8.9 - 10.3 mg/dL   GFR calc non Af Amer >60 >60 mL/min   GFR calc Af Amer >60 >60 mL/min    Comment: (NOTE) The eGFR has been calculated using the CKD EPI equation. This calculation has not been validated in all clinical situations. eGFR's persistently <60 mL/min signify possible Chronic Kidney Disease.    Anion gap 11 5 - 15  Glucose, capillary     Status: Abnormal   Collection Time: 07/01/15  7:59 AM  Result Value Ref Range   Glucose-Capillary 128 (H) 65 - 99 mg/dL   Comment 1 Notify RN    No results found.     Medical Problem List and Plan: 1.  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 2.  DVT Prophylaxis/Anticoagulation: Mechanical: Sequential compression devices, below knee Bilateral lower extremities 3. Pain Management: Tylenol prn pain. Ice and heat for MS discomfort.  4. Mood: LCSW to follow for evaluation and support.  5. Neuropsych: This patient is not capable of making decisions on his own behalf. 6. Skin/Wound Care: Routine pressure relief measures.  7. Fluids/Electrolytes/Nutrition: Monitor I/O. Check lytes in am. Start fluid restriction.  8.  DMT2: Monitor BS with ac/hs checks. Continue metformin bid 9. HTN: Monitor bid.  Continue Prinivil and metoprolol--hold HCTZ. Check orthostatic BP with history of frequent falls 10 Cerebral salt wasting disease: Hyponatremia likely due to TBI. Will place patient on salt tabs as well as 1200 cc fluid restrictions. Hold HCTZ and decrease Celexa to 5 mg daily.  Monitor for MS changes and/or seizures.  11. Cognitive deficits: History of TBI as a teenager.  12.  Dyslipidemia: On zocor.    Post Admission Physician Evaluation:  1. Functional deficits secondary  to traumatic subdural hematoma. 2. Patient is admitted to receive collaborative, interdisciplinary care between the  physiatrist, rehab nursing staff, and therapy team. 3. Patient's level of medical complexity and substantial therapy needs in context of that medical necessity cannot be provided at a lesser intensity of care such as a SNF. 4. Patient has experienced substantial functional loss from his/her baseline which was documented above under the "Functional History" and "Functional Status" headings.  Judging by the patient's diagnosis, physical exam, and functional history, the patient has potential for functional progress which will result in measurable gains while on inpatient rehab.  These gains will be of substantial and practical use upon discharge  in facilitating mobility and self-care at the household level. 5. Physiatrist will provide 24 hour management of medical needs as well as oversight of the therapy plan/treatment and provide guidance as appropriate regarding the interaction of the two. 6. 24 hour rehab nursing will assist with bladder management, safety, skin/wound care, disease management, medication administration and patient education and help integrate therapy concepts, techniques,education, etc. 7. PT will assess and treat for/with: Lower extremity strength, range of motion, stamina, balance, functional mobility, safety, adaptive techniques and equipment, woundcare, coping skills, pain control, education.   Goals are:  Supervision. 8. OT will assess and treat for/with: ADL's, functional mobility, safety, upper extremity strength, adaptive techniques and equipment, wound mgt, ego support, and community reintegration.   Goals are:  Supervision. Therapy  may proceed with showering this patient. 9. SLP will assess and treat for/with: cognition.  Goals are:  Supervision/min assist. 10. Case Management and Social Worker will assess and treat for psychological issues and discharge planning. 11. Team conference will be held weekly to assess progress toward goals and to determine barriers to  discharge. 12. Patient will receive at least 3 hours of therapy per day at least 5 days per week. 13. ELOS:  12-15 days.       14. Prognosis:  excellent and good   Delice Lesch, MD  07/01/2015

## 2015-06-30 NOTE — Progress Notes (Signed)
Patient ID: Steven HaddockFrank Duncan, male   DOB: [birth year], 82 y.o.   MRN: [id number]   LOS: 4 days   Subjective: No new c/o, still sore.   Objective: Vital signs in last 24 hours: Temp:  [98 F (36.7 C)-99.1 F (37.3 C)] 98.2 F (36.8 C) (04/04 0535) Pulse Rate:  [58-72] 62 (04/04 0852) Resp:  [16-18] 18 (04/04 0535) BP: (162-173)/(72-86) 163/80 mmHg (04/04 0659) SpO2:  [96 %-97 %] 97 % (04/04 0535) Last BM Date: 06/26/15   Laboratory  BMET  Recent Labs  06/29/15 1102 06/30/15 0501  NA 126* 124*  K 3.5 3.5  CL 89* 87*  CO2 28 26  GLUCOSE 127* 128*  BUN 9 8  CREATININE 0.72 0.71  CALCIUM 8.2* 8.4*   CBG (last 3)   Recent Labs  06/29/15 1656 06/29/15 2117 06/30/15 0803  GLUCAP 147* 120* 117*    Physical Exam General appearance: alert and no distress Resp: clear to auscultation bilaterally Cardio: regular rate and rhythm GI: normal findings: bowel sounds normal and soft, non-tender   Assessment/Plan: Fall TBI -- No interventions per Dr. Bevely Palmeritty Multiple medical problems -- Home meds FEN -- Add NaCl tabs VTE -- SCD's Dispo -- CIR when bed available    Freeman CaldronMichael J. Cyrilla Durkin, PA-C Pager: [phone] General Trauma PA Pager: [phone]  06/30/2015

## 2015-06-30 NOTE — Evaluation (Signed)
Speech Language Pathology Evaluation Patient Details Name: Steven Duncan [place] MRN: [id number] DOB: [birth year] Today's Date: 06/30/2015 Time:  -     Problem List:  Patient Active Problem List   Diagnosis Date Noted  . Acute subdural hematoma ([facility])   . Cognitive deficit as late effect of traumatic brain injury ([facility])   . Benign essential HTN   . Type 2 diabetes mellitus without complication, without long-term current use of insulin ([facility])   . HOH (hard of hearing)   . Hyponatremia   . Acute blood loss anemia   . SDH (subdural hematoma) ([facility]) 06/26/2015   Past Medical History:  Past Medical History  Diagnosis Date  . Diabetes ([facility])   . Gait disorder   . HTN (hypertension)   . Dyslipidemia   . HOH (hard of hearing)    Past Surgical History:  Past Surgical History  Procedure Laterality Date  . Cervical spine surgery      C5 and C6 hemilaminectomy?   HPI:  Steven Duncan Cousins is a 82 y.o. male s/p fall from standing while in shower. + LOC. No neurological complaints aside from headache. Mild traumatic brain injury Small falcine and tentorial subdural hematomas.   Assessment / Plan / Recommendation Clinical Impression  Pt presents with moderate memory deficits that result in tangential conversation and extra time needed and cueing to complete memory related tasks. Pt is able to store new information, but struggles to retrieve it and even in conversation he often searches  to recall details of past events. He is cheerful about this and reports it is his baseline. Otherwise he is able to attend to complex tasks and complete reasoning and problems with extra time. Pt would benefit from increased supervision with some tasks he is still indpendent with at home, such as managing his finances and medications. Difficutly does not appear acute, but pt could benefit from f/u SLP therapy in CIR setting to address safety and compensatory strategies with memory, particularly with functional tasks. Recommend CIR  at d/c.     SLP Assessment  All further Speech Lanaguage Pathology  needs can be addressed in the next venue of care    Follow Up Recommendations  Inpatient Rehab    Frequency and Duration           SLP Evaluation Prior Functioning  Cognitive/Linguistic Baseline: Baseline deficits Baseline deficit details: memory. Pt has a history of TBI when he was 21. But memory decline ahs come on with age.  Type of Home: House  Lives With: Daughter Available Help at Discharge: Family;Available PRN/intermittently Vocation: Retired   IT consultantCognition  Overall Cognitive Status: History of cognitive impairments - at baseline Arousal/Alertness: Awake/alert Orientation Level: Oriented to person;Oriented to place;Oriented to time;Oriented to situation Attention: Sustained;Selective Sustained Attention: Appears intact Selective Attention: Impaired Selective Attention Impairment: Functional complex;Verbal complex Memory: Impaired Memory Impairment: Retrieval deficit Awareness: Appears intact Problem Solving: Appears intact Safety/Judgment: Appears intact    Comprehension  Auditory Comprehension Overall Auditory Comprehension: Appears within functional limits for tasks assessed    Expression Verbal Expression Overall Verbal Expression: Appears within functional limits for tasks assessed   Oral / Motor  Oral Motor/Sensory Function Overall Oral Motor/Sensory Function: Within functional limits Motor Speech Overall Motor Speech: Appears within functional limits for tasks assessed   GO                   Harlon DittyBonnie Shariq Puig, MA CCC-SLP [phone]  Claudine MoutonDeBlois, Valyn Latchford Caroline 06/30/2015, 12:28 PM

## 2015-06-30 NOTE — Progress Notes (Signed)
I have left two messages for daughter on her cell at 1210 and 1515 today. No return call yet. I iwll follow up tomorrow for possible inpt rehab admission pending  discussion with Daughter, Leta JunglingMarcia. [phone]

## 2015-06-30 NOTE — Progress Notes (Signed)
Physical Therapy Treatment Patient Details Name: Steven Duncan MRN: [id number] DOB: Mar 06, 1929 Today's Date: 06/30/2015    History of Present Illness 82 y.o. male admitted to [facility] on 06/26/15 for fall at home with resultant SDH.  Pt with no significant PMHx listed in chart, although it appers, just based on his physical presentation that he does have some PMHx, just not documented at this time.     PT Comments    Pt performed gait and transfer training with mod assist to complete.  Pt fatigues easily and remains to c/o pain in neck during mobility.  Pt had BM during tx and RN staff notified.    Follow Up Recommendations  CIR     Equipment Recommendations  Rolling walker with 5" wheels    Recommendations for Other Services Rehab consult     Precautions / Restrictions Precautions Precautions: Fall Precaution Comments: significant fall risk, very HOH Restrictions Weight Bearing Restrictions: No    Mobility  Bed Mobility Overal bed mobility:  (Pt received in recliner chair.  )                Transfers Overall transfer level: Needs assistance Equipment used: Rolling walker (2 wheeled) Transfers: Sit to/from Stand Sit to Stand: Mod assist Stand pivot transfers: Mod assist       General transfer comment: Verbal and tactile cues for hand placement.  Pt required projection from PTA to follow VCs and required cues repeated.  Pt performed sit to stand from bed and commode.  Pt remains to present with poor safety and problem requiring max cues to maintain safety.    Ambulation/Gait Ambulation/Gait assistance: Mod assist Ambulation Distance (Feet): 20 Feet (+ 70 ft.  ) Assistive device: Rolling walker (2 wheeled) Gait Pattern/deviations: Step-through pattern;Steppage;Trunk flexed;Leaning posteriorly;Decreased stride length Gait velocity: decreased   General Gait Details: Pt required cues for hand placement on RW grips, maintaining close proximity to RW and assist to turn RW.   Pt required assist to improve posture.  Pt responds better to hand gestures for directions.     Stairs            Wheelchair Mobility    Modified Rankin (Stroke Patients Only)       Balance Overall balance assessment: Needs assistance Sitting-balance support: Feet supported Sitting balance-Leahy Scale: Good     Standing balance support: Bilateral upper extremity supported Standing balance-Leahy Scale: Poor                      Cognition Arousal/Alertness: Awake/alert Behavior During Therapy: WFL for tasks assessed/performed Overall Cognitive Status: Impaired/Different from baseline Area of Impairment: Memory     Memory: Decreased short-term memory         General Comments: Pt difficulty keeping his train of thought during conversation. Recalls events following his fall and reports being a young man.      Exercises      General Comments        Pertinent Vitals/Pain Pain Assessment: Faces Faces Pain Scale: Hurts little more Pain Location: neck Pain Descriptors / Indicators: Guarding;Grimacing;Sore Pain Intervention(s): Monitored during session;Repositioned    Home Living                      Prior Function            PT Goals (current goals can now be found in the care plan section) Acute Rehab PT Goals Patient Stated Goal: pt wants to get more steady  on his feet PT Goal Formulation: With patient Time For Goal Achievement: 06/29/15 Potential to Achieve Goals: Good Progress towards PT goals: Progressing toward goals    Frequency  Min 3X/week    PT Plan      Co-evaluation             End of Session Equipment Utilized During Treatment: Gait belt Activity Tolerance: Patient limited by fatigue Patient left: with bed alarm set;in chair;with chair alarm set     Time: [id number] PT Time Calculation (min) (ACUTE ONLY): 23 min  Charges:  $Gait Training: 8-22 mins $Therapeutic Activity: 8-22 mins                    G  Codes:      Steven Duncan 06/30/2015, 11:29 AM  Joycelyn RuaAimee Destinae Neubecker, PTA pager [phone]

## 2015-06-30 NOTE — Progress Notes (Signed)
I met with pt at bedside to discuss a possible inpt rehab admission today. I called his daughter, Tomi Bamberger, and left a message for her to call me back today for a possible admission today. I will follow up today. [phone]

## 2015-06-30 NOTE — Clinical Social Work Note (Signed)
Clinical Social Work Assessment  Patient Details  Name: Steven HaddockFrank Gaylord MRN: [id number] Date of Birth: 1928/09/10  Date of referral:  06/30/15               Reason for consult:  Discharge Planning                Permission sought to share information with:  Case Manager, Facility Medical sales representativeContact Representative, Family Supports Permission granted to share information::  Yes, Release of Information Signed  Name::      Lambert Mody(Marcia Nichols)  Agency::     Relationship::   (daughter)  Contact Information:   [phone])  Housing/Transportation Living arrangements for the past 2 months:  Single Family Home Source of Information:  Patient Patient Interpreter Needed:  None Criminal Activity/Legal Involvement Pertinent to Current Situation/Hospitalization:  No - Comment as needed Significant Relationships:  Adult Children Lives with:  Self Do you feel safe going back to the place where you live?  Yes Need for family participation in patient care:  Yes (Comment)  Care giving concerns:  Patient lives alone, slipped and fell at home.     Social Worker assessment / plan:  BSW intern enters patient room. Patient is alert and oriented eating breakfast at bedside. BSW intern explained PT recommendation which was CIR. BSW intern also explained how difficult it is to get placed in CIR and what would be another option just in case things do not go as planned. Patient is agreeable to SNF as well. BSW intern explained referral process. Patient is hard of hearing. [place] and requested we only look at facilities near that area. Patient does use roller walker at home per patient.   Employment status:  Retired Health and safety inspectornsurance information:  Medicare PT Recommendations:  Inpatient Rehab Consult Information / Referral to community resources:  Acute Rehab  Patient/Family's Response to care: patient is pretty much independent and has a good response to care.   Patient/Family's Understanding of and Emotional  Response to Diagnosis, Current Treatment, and Prognosis:  Patient understand current treatment and d/c/ planning.   Emotional Assessment Appearance:  Appears stated age Attitude/Demeanor/Rapport:   (talkative, friendly, welcoming) Affect (typically observed):  Accepting, Blunt, Calm Orientation:  Oriented to Self, Oriented to Place, Oriented to  Time, Oriented to Situation Alcohol / Substance use:  Never Used Psych involvement (Current and /or in the community):  No (Comment)  Discharge Needs  Concerns to be addressed:  No discharge needs identified Readmission within the last 30 days:  No Current discharge risk:  None Barriers to Discharge:  No Barriers Identified   Catheryn Baconharlean McNeil  BSW intern  [phone]

## 2015-06-30 NOTE — Care Management Note (Signed)
Case Management Note  Patient Details  Name: Steven HaddockFrank Duncan MRN: [id number] Date of Birth: 1929/01/27  Subjective/Objective:   Pt admitted on 06/26/15 s/p fall with resultant SDH.  PTA, pt independent, lives with family.                   Action/Plan: PT/OT recommending CIR.  Possible admission later today.  Will follow progress.    Expected Discharge Date:                  Expected Discharge Plan:  IP Rehab Facility  In-House Referral:     Discharge planning Services  CM Consult  Post Acute Care Choice:    Choice offered to:     DME Arranged:    DME Agency:     HH Arranged:    HH Agency:     Status of Service:  In process, will continue to follow  Medicare Important Message Given:  Yes Date Medicare IM Given:    Medicare IM give by:    Date Additional Medicare IM Given:    Additional Medicare Important Message give by:     If discussed at Long Length of Stay Meetings, dates discussed:    Additional Comments:  Quintella BatonJulie W. Kellyanne Ellwanger, RN, BSN  Trauma/Neuro ICU Case Manager [phone]

## 2015-06-30 NOTE — NC FL2 (Signed)
[place] MEDICAID FL2 LEVEL OF CARE SCREENING TOOL     IDENTIFICATION  Patient Name: Steven Duncan Birthdate: 1928/07/07 Sex: male Admission Date (Current Location): 06/26/2015  [facility] [place]   Facility and Address:  [facility] [place], [address], [place], [place] [address]      Provider Number: [id number]  Attending Physician Name and Address:  Trauma Md, MD  Relative Name and Phone Number:   (daughter Marlise Eves(Marica Nichols) [phone])    Current Level of Care: Hospital Recommended Level of Care:  (CIR) Prior Approval Number:    Date Approved/Denied:   PASRR Number:   [id number]   Discharge Plan:  (CIR or SNF)    Current Diagnoses: Patient Active Problem List   Diagnosis Date Noted  . Acute subdural hematoma (HCC)   . Cognitive deficit as late effect of traumatic brain injury (HCC)   . Benign essential HTN   . Type 2 diabetes mellitus without complication, without long-term current use of insulin (HCC)   . HOH (hard of hearing)   . Hyponatremia   . Acute blood loss anemia   . SDH (subdural hematoma) (HCC) 06/26/2015    Orientation RESPIRATION BLADDER Height & Weight     Self, Time, Situation  Normal Continent Weight: 154 lb 5.2 oz (70 kg) Height:  5\' 5"  (165.1 cm)  BEHAVIORAL SYMPTOMS/MOOD NEUROLOGICAL BOWEL NUTRITION STATUS     (He is alert and oriented to person, place, and time. He has normal strength. No sensory deficit. GCS eye subscore is 4. GCS verbal subscore is 5. GCS motor subscore is 6. ) Continent Diet (carb mod.)  AMBULATORY STATUS COMMUNICATION OF NEEDS Skin   Limited Assist Verbally Normal                       Personal Care Assistance Level of Assistance  Bathing, Feeding, Dressing Bathing Assistance: Limited assistance Feeding assistance: Limited assistance Dressing Assistance: Limited assistance     Functional Limitations Info  Sight, Hearing, Speech Sight Info: Adequate Hearing Info:  Impaired (wear hearing aids) Speech Info: Adequate    SPECIAL CARE FACTORS FREQUENCY  OT (By licensed OT), PT (By licensed PT)     PT Frequency: Min 3X/week OT Frequency: Min 2X/week            Contractures      Additional Factors Info  Code Status Code Status Info: full             Current Medications (06/30/2015):  This is the current hospital active medication list Current Facility-Administered Medications  Medication Dose Route Frequency Provider Last Rate Last Dose  . acetaminophen (TYLENOL) tablet 650 mg  650 mg Oral Q4H PRN Almond LintFaera Byerly, MD   650 mg at 06/29/15 0120  . amitriptyline (ELAVIL) tablet 10 mg  10 mg Oral QHS Harriette Bouillonhomas Cornett, MD   10 mg at 06/29/15 2107  . citalopram (CELEXA) tablet 10 mg  10 mg Oral Daily Harriette Bouillonhomas Cornett, MD   10 mg at 06/30/15 0852  . hydrochlorothiazide (HYDRODIURIL) tablet 25 mg  25 mg Oral Daily Harriette Bouillonhomas Cornett, MD   25 mg at 06/30/15 0854  . HYDROmorphone (DILAUDID) injection 0.5 mg  0.5 mg Intravenous Q4H PRN Freeman CaldronMichael J Jeffery, PA-C      . insulin aspart (novoLOG) injection 0-15 Units  0-15 Units Subcutaneous TID WC Harriette Bouillonhomas Cornett, MD   2 Units at 06/29/15 1824  . lisinopril (PRINIVIL,ZESTRIL) tablet 40 mg  40 mg Oral  Daily Harriette Bouillonhomas Cornett, MD   40 mg at 06/30/15 0855  . metFORMIN (GLUCOPHAGE) tablet 500 mg  500 mg Oral BID WC Harriette Bouillonhomas Cornett, MD   500 mg at 06/30/15 0852  . metoprolol (LOPRESSOR) injection 5 mg  5 mg Intravenous Q6H PRN Almond LintFaera Byerly, MD   5 mg at 06/30/15 [id number]  . metoprolol tartrate (LOPRESSOR) tablet 25 mg  25 mg Oral BID Almond LintFaera Byerly, MD   25 mg at 06/30/15 0855  . ondansetron (ZOFRAN) tablet 4 mg  4 mg Oral Q6H PRN Harriette Bouillonhomas Cornett, MD       Or  . ondansetron [facility](ZOFRAN) injection 4 mg  4 mg Intravenous Q6H PRN Harriette Bouillonhomas Cornett, MD   4 mg at 06/27/15 1301  . potassium chloride SA (K-DUR,KLOR-CON) CR tablet 40 mEq  40 mEq Oral Daily Almond LintFaera Byerly, MD   40 mEq at 06/30/15 0854  . simvastatin (ZOCOR) tablet 40 mg  40 mg Oral  q1800 Harriette Bouillonhomas Cornett, MD   40 mg at 06/29/15 1825  . sodium chloride tablet 1 g  1 g Oral TID WC Freeman CaldronMichael J Jeffery, PA-C      . traMADol Janean Sark(ULTRAM) tablet 50-100 mg  50-100 mg Oral Q6H PRN Freeman CaldronMichael J Jeffery, PA-C   50 mg at 06/29/15 2107     Discharge Medications: Please see discharge summary for a list of discharge medications.  Relevant Imaging Results:  Relevant Lab Results:   Additional Information SS# [id number]  Catheryn Baconharlean McNeil  BSW intern  [phone]

## 2015-06-30 NOTE — Progress Notes (Signed)
I received a call from pt's daughter, Leta JunglingMarcia, at [phone] upon her arrival to visit her Dad. We discussed an inpt rehab admission and she is in agreement. I will arrange for tomorrow. [phone]

## 2015-06-30 NOTE — NC FL2 (Deleted)
[place] MEDICAID FL2 LEVEL OF CARE SCREENING TOOL     IDENTIFICATION  Patient Name: Steven HaddockFrank Peckinpaugh Birthdate: 1928/07/07 Sex: male Admission Date (Current Location): 06/26/2015  [facility] [place]   Facility and Address:  [facility] [place], [address], [place], [place] [address]      Provider Number: [id number]  Attending Physician Name and Address:  Trauma Md, MD  Relative Name and Phone Number:   (daughter Marlise Eves(Marica Nichols) [phone])    Current Level of Care: Hospital Recommended Level of Care:  (CIR) Prior Approval Number:    Date Approved/Denied:   PASRR Number:   [id number]   Discharge Plan:  (CIR or SNF)    Current Diagnoses: Patient Active Problem List   Diagnosis Date Noted  . Acute subdural hematoma (HCC)   . Cognitive deficit as late effect of traumatic brain injury (HCC)   . Benign essential HTN   . Type 2 diabetes mellitus without complication, without long-term current use of insulin (HCC)   . HOH (hard of hearing)   . Hyponatremia   . Acute blood loss anemia   . SDH (subdural hematoma) (HCC) 06/26/2015    Orientation RESPIRATION BLADDER Height & Weight     Self, Time, Situation  Normal Continent Weight: 154 lb 5.2 oz (70 kg) Height:  5\' 5"  (165.1 cm)  BEHAVIORAL SYMPTOMS/MOOD NEUROLOGICAL BOWEL NUTRITION STATUS     (He is alert and oriented to person, place, and time. He has normal strength. No sensory deficit. GCS eye subscore is 4. GCS verbal subscore is 5. GCS motor subscore is 6. ) Continent Diet (carb mod.)  AMBULATORY STATUS COMMUNICATION OF NEEDS Skin   Limited Assist Verbally Normal                       Personal Care Assistance Level of Assistance  Bathing, Feeding, Dressing Bathing Assistance: Limited assistance Feeding assistance: Limited assistance Dressing Assistance: Limited assistance     Functional Limitations Info  Sight, Hearing, Speech Sight Info: Adequate Hearing Info:  Impaired (wear hearing aids) Speech Info: Adequate    SPECIAL CARE FACTORS FREQUENCY  OT (By licensed OT), PT (By licensed PT)     PT Frequency: Min 3X/week OT Frequency: Min 2X/week            Contractures      Additional Factors Info  Code Status Code Status Info: full             Current Medications (06/30/2015):  This is the current hospital active medication list Current Facility-Administered Medications  Medication Dose Route Frequency Provider Last Rate Last Dose  . acetaminophen (TYLENOL) tablet 650 mg  650 mg Oral Q4H PRN Almond LintFaera Byerly, MD   650 mg at 06/29/15 0120  . amitriptyline (ELAVIL) tablet 10 mg  10 mg Oral QHS Harriette Bouillonhomas Cornett, MD   10 mg at 06/29/15 2107  . citalopram (CELEXA) tablet 10 mg  10 mg Oral Daily Harriette Bouillonhomas Cornett, MD   10 mg at 06/30/15 0852  . hydrochlorothiazide (HYDRODIURIL) tablet 25 mg  25 mg Oral Daily Harriette Bouillonhomas Cornett, MD   25 mg at 06/30/15 0854  . HYDROmorphone (DILAUDID) injection 0.5 mg  0.5 mg Intravenous Q4H PRN Freeman CaldronMichael J Jeffery, PA-C      . insulin aspart (novoLOG) injection 0-15 Units  0-15 Units Subcutaneous TID WC Harriette Bouillonhomas Cornett, MD   2 Units at 06/29/15 1824  . lisinopril (PRINIVIL,ZESTRIL) tablet 40 mg  40 mg Oral  Daily Harriette Bouillon, MD   40 mg at 06/30/15 0855  . metFORMIN (GLUCOPHAGE) tablet 500 mg  500 mg Oral BID WC Harriette Bouillon, MD   500 mg at 06/30/15 0852  . metoprolol (LOPRESSOR) injection 5 mg  5 mg Intravenous Q6H PRN Almond Lint, MD   5 mg at 06/30/15 0454  . metoprolol tartrate (LOPRESSOR) tablet 25 mg  25 mg Oral BID Almond Lint, MD   25 mg at 06/30/15 0855  . ondansetron (ZOFRAN) tablet 4 mg  4 mg Oral Q6H PRN Harriette Bouillon, MD       Or  . ondansetron [facility]) injection 4 mg  4 mg Intravenous Q6H PRN Harriette Bouillon, MD   4 mg at 06/27/15 1301  . potassium chloride SA (K-DUR,KLOR-CON) CR tablet 40 mEq  40 mEq Oral Daily Almond Lint, MD   40 mEq at 06/30/15 0854  . simvastatin (ZOCOR) tablet 40 mg  40 mg Oral  q1800 Harriette Bouillon, MD   40 mg at 06/29/15 1825  . sodium chloride tablet 1 g  1 g Oral TID WC Freeman Caldron, PA-C      . traMADol Janean Sark) tablet 50-100 mg  50-100 mg Oral Q6H PRN Freeman Caldron, PA-C   50 mg at 06/29/15 2107     Discharge Medications: Please see discharge summary for a list of discharge medications.  Relevant Imaging Results:  Relevant Lab Results:   Additional Information SS# [id number]  Mearl Latin, LCSWA

## 2015-06-30 NOTE — PMR Pre-admission (Signed)
PMR Admission Coordinator Pre-Admission Assessment  Patient: Steven Duncan is an 82 y.o., male MRN: [id number] DOB: 05-27-1928 Height: 5\' 5"  (165.1 cm) Weight: 70 kg (154 lb 5.2 oz)              Insurance Information HMO:     PPO:      PCP:      IPA:      80/20: yes     OTHER: no HMO PRIMARY: Medicare a and b      Policy#: [id number]      Subscriber: pt Benefits:  Phone #: passport online     Name: 06/30/15 Eff. Date: a 09/26/95 b 10/27/95     Deduct: $1316      Out of Pocket Max: none      Life Max: none CIR: 100%      SNF: 20 full days Outpatient: 80%     Co-Pay: 20% Home Health: 100%      Co-Pay: none DME: 80%     Co-Pay: 20% Providers: pt choice  SECONDARY: Sharen Counter      Policy#: [id number]      Subscriber: pt  Medicaid Application Date:       Case Manager:  Disability Application Date:       Case Worker:   Emergency Contact Information Contact Information    Name Relation Home Work Mobile   Cable Daughter [phone]       Current Medical History  Patient Admitting Diagnosis: Traumatic SDH  History of Present Illness:Steven Duncan is a 82 y.o. right handed male with history of HTN, DMT2, depression, HOH, frequent falls, cognitive deficits who was admitted on 06/26/2015 after mechanical fall while in the shower with positive LOC and complaints of head and neck pain. Family found patient confused and wandering around the house. CT head revealed small falcine and tentorial subdural hematoma and Dr. Bevely Palmer felt that no surgical intervention needed. Patient to follow with NS in 3-4 weeks for follow CCT. CT cervical spine showed extensive degenerative C4- T1, post surgical changes C5 and C6, old odontoid and C1 posterior arch fractures. Therapy evaluations done yesterday revealing unsteady gait, confusion and poor safety awareness affecting mobility as well as ability to carry out ADL task.   Past Medical History  Past Medical History  Diagnosis Date  . Diabetes (HCC)   .  Gait disorder   . HTN (hypertension)   . Dyslipidemia   . HOH (hard of hearing)     Family History  family history is not on file.  Prior Rehab/Hospitalizations:  Has the patient had major surgery during 100 days prior to admission? No  Current Medications   Current facility-administered medications:  .  acetaminophen (TYLENOL) tablet 650 mg, 650 mg, Oral, Q4H PRN, Almond Lint, MD, 650 mg at 06/29/15 0120 .  amitriptyline (ELAVIL) tablet 10 mg, 10 mg, Oral, QHS, Harriette Bouillon, MD, 10 mg at 06/30/15 2225 .  citalopram (CELEXA) tablet 10 mg, 10 mg, Oral, Daily, Harriette Bouillon, MD, 10 mg at 06/30/15 6629 .  hydrochlorothiazide (HYDRODIURIL) tablet 25 mg, 25 mg, Oral, Daily, Harriette Bouillon, MD, 25 mg at 06/30/15 0854 .  HYDROmorphone (DILAUDID) injection 0.5 mg, 0.5 mg, Intravenous, Q4H PRN, Freeman Caldron, PA-C, 0.5 mg at 06/30/15 2225 .  insulin aspart (novoLOG) injection 0-15 Units, 0-15 Units, Subcutaneous, TID WC, Harriette Bouillon, MD, 2 Units at 06/30/15 1736 .  lisinopril (PRINIVIL,ZESTRIL) tablet 40 mg, 40 mg, Oral, Daily, Harriette Bouillon, MD, 40 mg at 06/30/15 0855 .  metFORMIN (GLUCOPHAGE) tablet 500 mg, 500 mg, Oral, BID WC, Harriette Bouillon, MD, 500 mg at 06/30/15 1736 .  metoprolol (LOPRESSOR) injection 5 mg, 5 mg, Intravenous, Q6H PRN, Almond Lint, MD, 5 mg at 06/30/15 7829 .  metoprolol tartrate (LOPRESSOR) tablet 25 mg, 25 mg, Oral, BID, Almond Lint, MD, 25 mg at 06/30/15 2225 .  ondansetron (ZOFRAN) tablet 4 mg, 4 mg, Oral, Q6H PRN **OR** ondansetron (ZOFRAN) injection 4 mg, 4 mg, Intravenous, Q6H PRN, Harriette Bouillon, MD, 4 mg at 06/30/15 1357 .  potassium chloride SA (K-DUR,KLOR-CON) CR tablet 40 mEq, 40 mEq, Oral, Daily, Almond Lint, MD, 40 mEq at 06/30/15 0854 .  simvastatin (ZOCOR) tablet 40 mg, 40 mg, Oral, q1800, Harriette Bouillon, MD, 40 mg at 06/30/15 1736 .  sodium chloride tablet 1 g, 1 g, Oral, TID WC, Freeman Caldron, PA-C, 1 g at 06/30/15 1746 .  traMADol  (ULTRAM) tablet 50-100 mg, 50-100 mg, Oral, Q6H PRN, Freeman Caldron, PA-C, 50 mg at 06/30/15 1746  Patients Current Diet: Diet Carb Modified Fluid consistency:: Thin; Room service appropriate?: Yes  Precautions / Restrictions Precautions Precautions: Fall Precaution Comments: significant fall risk, very HOH Restrictions Weight Bearing Restrictions: No   Has the patient had 2 or more falls or a fall with injury in the past year?No Pt has history of infrequent falls. Does not like to use cane.  Prior Activity Level Limited Community (1-2x/wk): daughter reports pt I with adls, would not use cane, drove Showered himself and drove pta  Journalist, newspaper / Equipment Home Equipment: Cane - single point, Shower seat, Hand held shower head, Grab bars - tub/shower  Prior Device Use: Indicate devices/aids used by the patient prior to current illness, exacerbation or injury? cane  Prior Functional Level Prior Function Level of Independence: Independent with assistive device(s) Comments: uses cane for gait, reports he drives, daughter does IADL  Self Care: Did the patient need help bathing, dressing, using the toilet or eating?  Independent  Indoor Mobility: Did the patient need assistance with walking from room to room (with or without device)? Independent  Stairs: Did the patient need assistance with internal or external stairs (with or without device)? Independent  Functional Cognition: Did the patient need help planning regular tasks such as shopping or remembering to take medications? Needed some help  Current Functional Level Cognition  Arousal/Alertness: Awake/alert Overall Cognitive Status: History of cognitive impairments - at baseline Difficult to assess due to: Hard of hearing/deaf Orientation Level: Oriented X4 General Comments: Pt difficulty keeping his train of thought during conversation. Recalls events following his fall and reports being a young man.    Attention: Sustained, Selective Sustained Attention: Appears intact Selective Attention: Impaired Selective Attention Impairment: Functional complex, Verbal complex Memory: Impaired Memory Impairment: Retrieval deficit Awareness: Appears intact Problem Solving: Appears intact Safety/Judgment: Appears intact    Extremity Assessment (includes Sensation/Coordination)  Upper Extremity Assessment: RUE deficits/detail, LUE deficits/detail RUE Deficits / Details: generalized weakness, arthritic changes in hand, reports R side is his bad side, has a hx of TBI RUE Coordination: decreased fine motor LUE Deficits / Details: generalized weakness, arthritic changes in hand LUE Coordination: decreased fine motor  Lower Extremity Assessment: Defer to PT evaluation RLE Deficits / Details: per pt report he has right ankle problems.  He relates them to swelling (none today), but has a steppage gait pattern on this side when walking.     ADLs  Overall ADL's : Needs assistance/impaired Eating/Feeding: Minimal assistance, Sitting Grooming: Wash/dry  hands, Wash/dry face, Min guard, Sitting Upper Body Bathing: Minimal assitance, Sitting Lower Body Bathing: Maximal assistance, Sit to/from stand Upper Body Dressing : Minimal assistance, Sitting Lower Body Dressing: Maximal assistance, Sit to/from stand Toilet Transfer: Moderate assistance, Stand-pivot, BSC Toileting- Clothing Manipulation and Hygiene: Maximal assistance, Sit to/from stand Functional mobility during ADLs: Moderate assistance, Rolling walker    Mobility  Overal bed mobility:  (Pt received in recliner chair.  ) Bed Mobility: Sit to Supine, Supine to Sit Supine to sit: Mod assist Sit to supine: Mod assist General bed mobility comments: assist to raise trunk and for lifting LEs back into bed    Transfers  Overall transfer level: Needs assistance Equipment used: Rolling walker (2 wheeled) Transfers: Sit to/from Stand Sit to Stand: Mod  assist Stand pivot transfers: Mod assist General transfer comment: Verbal and tactile cues for hand placement.  Pt required projection from PTA to follow VCs and required cues repeated.  Pt performed sit to stand from bed and commode.  Pt remains to present with poor safety and problem requiring max cues to maintain safety.      Ambulation / Gait / Stairs / Wheelchair Mobility  Ambulation/Gait Ambulation/Gait assistance: Mod assist Ambulation Distance (Feet): 20 Feet (+ 70 ft.  ) Assistive device: Rolling walker (2 wheeled) Gait Pattern/deviations: Step-through pattern, Steppage, Trunk flexed, Leaning posteriorly, Decreased stride length General Gait Details: Pt required cues for hand placement on RW grips, maintaining close proximity to RW and assist to turn RW.  Pt required assist to improve posture.  Pt responds better to hand gestures for directions.   Gait velocity: decreased Gait velocity interpretation: <1.8 ft/sec, indicative of risk for recurrent falls    Posture / Balance Balance Overall balance assessment: Needs assistance Sitting-balance support: Feet supported Sitting balance-Leahy Scale: Good Postural control: Posterior lean Standing balance support: Bilateral upper extremity supported Standing balance-Leahy Scale: Poor Standing balance comment: pt, when he stands up tall and looks up has a posterior preference in standing.  He needs the support of an assistive device AND the therapist for balance in standing.     Special needs/care consideration  Skin bruising to left neck and back of head                               Bowel mgmt: continent LBM 06/30/15 Bladder mgmt:some incontinence Diabetic mgmt yes Very HOH. Has hearing aid Daughter states pt has hard time understanding Southern accent. Moved from [place] Nov 11, 2014. Wife died 2012/08/10   Previous Home Environment Living Arrangements: Children (lives with his daughter, her 89 yo son and there is adult Futures trader)  Lives With:  Daughter (and her family since 11/11/14) Available Help at Discharge: Family, Available PRN/intermittently Type of Home: House Home Layout: Two level, Able to live on main level with bedroom/bathroom Home Access: Stairs to enter Entrance Stairs-Rails: Right, Left, Can reach both Entrance Stairs-Number of Steps: 6 Bathroom Shower/Tub: Health visitor: Standard Bathroom Accessibility: Yes How Accessible: Accessible via walker Home Care Services: No Additional Comments: Pt very HOH, daughter clarified home layout Daughter is school teacher so gone until about 5 pm daily. 72 yo grandson in the home. Adult granddaughter lives in apartment  over garage with her two year old.  Discharge Living Setting Plans for Discharge Living Setting: Lives with (comment) (daughter and her family since 11/11/2014) Type of Home at Discharge: House Discharge Home Layout: Two level, Able to live  on main level with bedroom/bathroom Alternate Level Stairs-Rails: Right, Left, Can reach both Discharge Home Access: Stairs to enter Entrance Stairs-Rails: Right, Left, Can reach both Entrance Stairs-Number of Steps: 6 Discharge Bathroom Shower/Tub: Walk-in shower Discharge Bathroom Toilet: Standard Discharge Bathroom Accessibility: Yes How Accessible: Accessible via walker Does the patient have any problems obtaining your medications?: No Moved from [place] 10/2014  Social/Family/Support Systems Patient Roles: Parent Contact Information: Lambert ModyMarcia Nichols, daughter Anticipated Caregiver: daughter Anticipated Caregiver's Contact Information: see above Ability/Limitations of Caregiver: daughter works as a Management consultantschool teacher Caregiver Availability: Evenings only (80 yo grandson in the home and another adult granddaughter i) Discharge Plan Discussed with Primary Caregiver: Yes Is Caregiver In Agreement with Plan?: Yes Does Caregiver/Family have Issues with Lodging/Transportation while Pt is in Rehab?:  No  Goals/Additional Needs Patient/Family Goal for Rehab: Mod I to supervision with PT, OT, and SLP Expected length of stay: ELOS 13-17 days Equipment Needs: VERY HOH. Has hearing aids Special Service Needs: Pt finds it hard to understand southern accent and [facility], [place] is from [place] Pt/Family Agrees to Admission and willing to participate: Yes Program Orientation Provided & Reviewed with Pt/Caregiver Including Roles  & Responsibilities: Yes Daughter feels cognitively he is not far from baseline. Doubt he will need 24/7 supervision.  Decrease burden of Care through IP rehab admission: n/a  Possible need for SNF placement upon discharge:not anticipated  Patient Condition: This patient's condition remains as documented in the consult dated 06/29/2015, in which the Rehabilitation Physician determined and documented that the patient's condition is appropriate for intensive rehabilitative care in an inpatient rehabilitation facility. Will admit to inpatient rehab today.  Preadmission Screen Completed By:  Clois DupesBoyette, Khamari Yousuf Godwin, 07/01/2015 7:37 AM ______________________________________________________________________   Discussed status with Dr. Allena KatzPatel on 07/01/2015 at  [phone] and received telephone approval for admission today.  Admission Coordinator:  Clois DupesBoyette, Beckem Tomberlin Godwin, time [phone] Date 07/01/2015

## 2015-07-01 ENCOUNTER — Inpatient Hospital Stay (HOSPITAL_COMMUNITY)
Admission: RE | Admit: 2015-07-01 | Discharge: 2015-07-14 | DRG: 949 | Disposition: A | Discharge status: Home Health | Payer: Medicare Other | Source: Intra-hospital | Attending: Physical Medicine & Rehabilitation | Admitting: Physical Medicine & Rehabilitation

## 2015-07-01 ENCOUNTER — Inpatient Hospital Stay (HOSPITAL_COMMUNITY): Payer: Medicare Other | Admitting: Occupational Therapy

## 2015-07-01 ENCOUNTER — Inpatient Hospital Stay (HOSPITAL_COMMUNITY): Payer: Medicare Other | Admitting: Physical Therapy

## 2015-07-01 ENCOUNTER — Encounter (HOSPITAL_COMMUNITY): Payer: Self-pay | Admitting: General Practice

## 2015-07-01 DIAGNOSIS — R5383 Other fatigue: Secondary | ICD-10-CM | POA: Insufficient documentation

## 2015-07-01 DIAGNOSIS — E119 Type 2 diabetes mellitus without complications: Secondary | ICD-10-CM

## 2015-07-01 DIAGNOSIS — H9190 Unspecified hearing loss, unspecified ear: Secondary | ICD-10-CM

## 2015-07-01 DIAGNOSIS — F329 Major depressive disorder, single episode, unspecified: Secondary | ICD-10-CM | POA: Diagnosis not present

## 2015-07-01 DIAGNOSIS — W1830XD Fall on same level, unspecified, subsequent encounter: Secondary | ICD-10-CM

## 2015-07-01 DIAGNOSIS — R413 Other amnesia: Secondary | ICD-10-CM | POA: Diagnosis not present

## 2015-07-01 DIAGNOSIS — E871 Hypo-osmolality and hyponatremia: Secondary | ICD-10-CM | POA: Diagnosis not present

## 2015-07-01 DIAGNOSIS — S069XAS Unspecified intracranial injury with loss of consciousness status unknown, sequela: Secondary | ICD-10-CM | POA: Insufficient documentation

## 2015-07-01 DIAGNOSIS — E118 Type 2 diabetes mellitus with unspecified complications: Secondary | ICD-10-CM | POA: Insufficient documentation

## 2015-07-01 DIAGNOSIS — S069X3S Unspecified intracranial injury with loss of consciousness of 1 hour to 5 hours 59 minutes, sequela: Secondary | ICD-10-CM | POA: Diagnosis not present

## 2015-07-01 DIAGNOSIS — E785 Hyperlipidemia, unspecified: Secondary | ICD-10-CM

## 2015-07-01 DIAGNOSIS — S069XAA Unspecified intracranial injury with loss of consciousness status unknown, initial encounter: Secondary | ICD-10-CM | POA: Diagnosis present

## 2015-07-01 DIAGNOSIS — I10 Essential (primary) hypertension: Secondary | ICD-10-CM

## 2015-07-01 DIAGNOSIS — S069X9S Unspecified intracranial injury with loss of consciousness of unspecified duration, sequela: Secondary | ICD-10-CM

## 2015-07-01 DIAGNOSIS — S069X9A Unspecified intracranial injury with loss of consciousness of unspecified duration, initial encounter: Secondary | ICD-10-CM | POA: Diagnosis present

## 2015-07-01 DIAGNOSIS — R4189 Other symptoms and signs involving cognitive functions and awareness: Secondary | ICD-10-CM | POA: Diagnosis not present

## 2015-07-01 DIAGNOSIS — Z79899 Other long term (current) drug therapy: Secondary | ICD-10-CM

## 2015-07-01 DIAGNOSIS — R296 Repeated falls: Secondary | ICD-10-CM | POA: Diagnosis not present

## 2015-07-01 DIAGNOSIS — I951 Orthostatic hypotension: Secondary | ICD-10-CM

## 2015-07-01 DIAGNOSIS — Z87820 Personal history of traumatic brain injury: Secondary | ICD-10-CM | POA: Diagnosis not present

## 2015-07-01 DIAGNOSIS — S069X2S Unspecified intracranial injury with loss of consciousness of 31 minutes to 59 minutes, sequela: Secondary | ICD-10-CM | POA: Diagnosis not present

## 2015-07-01 DIAGNOSIS — R350 Frequency of micturition: Secondary | ICD-10-CM

## 2015-07-01 DIAGNOSIS — Z7984 Long term (current) use of oral hypoglycemic drugs: Secondary | ICD-10-CM

## 2015-07-01 DIAGNOSIS — R269 Unspecified abnormalities of gait and mobility: Secondary | ICD-10-CM | POA: Diagnosis not present

## 2015-07-01 DIAGNOSIS — S065X3D Traumatic subdural hemorrhage with loss of consciousness of 1 hour to 5 hours 59 minutes, subsequent encounter: Principal | ICD-10-CM

## 2015-07-01 DIAGNOSIS — Z7982 Long term (current) use of aspirin: Secondary | ICD-10-CM | POA: Diagnosis not present

## 2015-07-01 DIAGNOSIS — F068 Other specified mental disorders due to known physiological condition: Secondary | ICD-10-CM

## 2015-07-01 DIAGNOSIS — S069X0S Unspecified intracranial injury without loss of consciousness, sequela: Secondary | ICD-10-CM

## 2015-07-01 DIAGNOSIS — S069X1S Unspecified intracranial injury with loss of consciousness of 30 minutes or less, sequela: Secondary | ICD-10-CM

## 2015-07-01 LAB — BASIC METABOLIC PANEL
ANION GAP: 11 (ref 5–15)
BUN: 13 mg/dL (ref 6–20)
CALCIUM: 9 mg/dL (ref 8.9–10.3)
CHLORIDE: 88 mmol/L — AB (ref 101–111)
CO2: 29 mmol/L (ref 22–32)
Creatinine, Ser: 0.8 mg/dL (ref 0.61–1.24)
GFR calc non Af Amer: >60 mL/min (ref >60)
GLUCOSE: 156 mg/dL — AB (ref 65–99)
Potassium: 4.9 mmol/L (ref 3.5–5.1)
Sodium: 128 mmol/L — ABNORMAL LOW (ref 135–145)

## 2015-07-01 LAB — GLUCOSE, CAPILLARY
Glucose-Capillary: 128 mg/dL — ABNORMAL HIGH (ref 65–99)
Glucose-Capillary: 130 mg/dL — ABNORMAL HIGH (ref 65–99)
Glucose-Capillary: 117 mg/dL — ABNORMAL HIGH (ref 65–99)

## 2015-07-01 MED ORDER — TRAMADOL HCL 50 MG PO TABS
50.0000 mg | ORAL_TABLET | Freq: Four times a day (QID) | ORAL | Status: DC | PRN
  Administered 2015-07-01 – 2015-07-11 (×9): 50 mg via ORAL
  Filled 2015-07-01 (×10): qty 1

## 2015-07-01 MED ORDER — ALUM & MAG HYDROXIDE-SIMETH 200-200-20 MG/5ML PO SUSP: 30.0000 mL | ORAL | Status: DC | PRN

## 2015-07-01 MED ORDER — INSULIN ASPART 100 UNIT/ML ~~LOC~~ SOLN
0.0000 [IU] | Freq: Three times a day (TID) | SUBCUTANEOUS | Status: DC
Start: 2015-07-01 — End: 2015-07-06
  Administered 2015-07-01 – 2015-07-06 (×7): 2 [IU] via SUBCUTANEOUS

## 2015-07-01 MED ORDER — GUAIFENESIN-DM 100-10 MG/5ML PO SYRP: 5.0000 mL | ORAL_SOLUTION | Freq: Four times a day (QID) | ORAL | Status: DC | PRN

## 2015-07-01 MED ORDER — TROLAMINE SALICYLATE 10 % EX CREA
TOPICAL_CREAM | Freq: Three times a day (TID) | CUTANEOUS | Status: DC
  Filled 2015-07-01: qty 85

## 2015-07-01 MED ORDER — BISACODYL 10 MG RE SUPP: 10.0000 mg | Freq: Every day | RECTAL | Status: DC | PRN

## 2015-07-01 MED ORDER — METFORMIN HCL 500 MG PO TABS
500.0000 mg | ORAL_TABLET | Freq: Two times a day (BID) | ORAL | Status: DC
  Administered 2015-07-01 – 2015-07-14 (×26): 500 mg via ORAL
  Filled 2015-07-01 (×26): qty 1

## 2015-07-01 MED ORDER — AMITRIPTYLINE HCL 10 MG PO TABS
10.0000 mg | ORAL_TABLET | Freq: Every day | ORAL | Status: DC
  Administered 2015-07-01 – 2015-07-13 (×13): 10 mg via ORAL
  Filled 2015-07-01 (×13): qty 1

## 2015-07-01 MED ORDER — TRAZODONE HCL 50 MG PO TABS: 25.0000 mg | ORAL_TABLET | Freq: Every evening | ORAL | Status: DC | PRN

## 2015-07-01 MED ORDER — ONDANSETRON HCL 4 MG PO TABS: 4.0000 mg | ORAL_TABLET | Freq: Four times a day (QID) | ORAL | Status: DC | PRN

## 2015-07-01 MED ORDER — METOPROLOL TARTRATE 25 MG PO TABS
25.0000 mg | ORAL_TABLET | Freq: Two times a day (BID) | ORAL | Status: DC
  Administered 2015-07-01 – 2015-07-14 (×25): 25 mg via ORAL
  Filled 2015-07-01 (×26): qty 1

## 2015-07-01 MED ORDER — SODIUM CHLORIDE 1 G PO TABS
1.0000 g | ORAL_TABLET | Freq: Three times a day (TID) | ORAL | Status: DC
  Administered 2015-07-01 – 2015-07-06 (×14): 1 g via ORAL
  Filled 2015-07-01 (×16): qty 1

## 2015-07-01 MED ORDER — SIMVASTATIN 40 MG PO TABS
40.0000 mg | ORAL_TABLET | Freq: Every day | ORAL | Status: DC
  Administered 2015-07-01 – 2015-07-13 (×13): 40 mg via ORAL
  Filled 2015-07-01 (×13): qty 1

## 2015-07-01 MED ORDER — FLEET ENEMA 7-19 GM/118ML RE ENEM: 1.0000 | ENEMA | Freq: Once | RECTAL | Status: DC | PRN

## 2015-07-01 MED ORDER — SENNOSIDES-DOCUSATE SODIUM 8.6-50 MG PO TABS: 1.0000 | ORAL_TABLET | Freq: Every evening | ORAL | Status: DC | PRN

## 2015-07-01 MED ORDER — MUSCLE RUB 10-15 % EX CREA
TOPICAL_CREAM | Freq: Three times a day (TID) | CUTANEOUS | Status: DC
  Administered 2015-07-01 – 2015-07-14 (×27): via TOPICAL
  Filled 2015-07-01 (×2): qty 85

## 2015-07-01 MED ORDER — METHOCARBAMOL 500 MG PO TABS
250.0000 mg | ORAL_TABLET | Freq: Four times a day (QID) | ORAL | Status: DC | PRN
Start: 2015-07-01 — End: 2015-07-14

## 2015-07-01 MED ORDER — DIPHENHYDRAMINE HCL 12.5 MG/5ML PO ELIX: 12.5000 mg | ORAL_SOLUTION | Freq: Four times a day (QID) | ORAL | Status: DC | PRN

## 2015-07-01 MED ORDER — ACETAMINOPHEN 325 MG PO TABS
650.0000 mg | ORAL_TABLET | ORAL | Status: DC | PRN
  Administered 2015-07-09 – 2015-07-14 (×3): 650 mg via ORAL
  Filled 2015-07-01 (×3): qty 2

## 2015-07-01 MED ORDER — LISINOPRIL 20 MG PO TABS
20.0000 mg | ORAL_TABLET | Freq: Two times a day (BID) | ORAL | Status: DC
  Administered 2015-07-02 – 2015-07-14 (×25): 20 mg via ORAL
  Filled 2015-07-01 (×25): qty 1

## 2015-07-01 MED ORDER — CITALOPRAM HYDROBROMIDE 10 MG PO TABS
5.0000 mg | ORAL_TABLET | Freq: Every day | ORAL | Status: DC
  Administered 2015-07-02 – 2015-07-14 (×13): 5 mg via ORAL
  Filled 2015-07-01 (×13): qty 1

## 2015-07-01 MED ORDER — ONDANSETRON HCL 4 MG/2ML IJ SOLN: 4.0000 mg | Freq: Four times a day (QID) | INTRAMUSCULAR | Status: DC | PRN

## 2015-07-01 NOTE — Care Management Note (Signed)
Case Management Note  Patient Details  Name: Steven Duncan MRN: [id number] Date of Birth: [birth year]  Subjective/Objective: Pt medically stable for dc today.                     Action/Plan: Pt [facility].    Expected Discharge Date:    07/01/2015              Expected Discharge Plan:  IP Rehab Facility  In-House Referral:     Discharge planning Services  CM Consult  Post Acute Care Choice:    Choice offered to:     DME Arranged:    DME Agency:     HH Arranged:    HH Agency:     Status of Service:  Completed, signed off  Medicare Important Message Given:  Yes Date Medicare IM Given:    Medicare IM give by:    Date Additional Medicare IM Given:    Additional Medicare Important Message give by:     If discussed at Long Length of Stay Meetings, dates discussed:    Additional Comments:  Quintella BatonJulie W. Eily Louvier, RN, BSN  Trauma/Neuro ICU Case Manager [phone]

## 2015-07-01 NOTE — Progress Notes (Signed)
Standley Brooking, RN Rehab Admission Coordinator Signed Physical Medicine and Rehabilitation PMR Pre-admission 06/30/2015 3:17 PM  Related encounter: ED to Hosp-Admission (Current) from 06/26/2015 in [facility] 5W MEDICAL    Expand All Collapse All   PMR Admission Coordinator Pre-Admission Assessment  Patient: Steven Duncan is an 82 y.o., male MRN: [id number] DOB: 01/22/1929 Height: 5\' 5"  (165.1 cm) Weight: 70 kg (154 lb 5.2 oz)  Insurance Information HMO: PPO: PCP: IPA: 80/20: yes OTHER: no HMO PRIMARY: Medicare a and b Policy#: [id number] Subscriber: pt Benefits: Phone #: passport online Name: 06/30/15 Eff. Date: a 09/26/95 b 10/27/95 Deduct: $1316 Out of Pocket Max: none Life Max: none CIR: 100% SNF: 20 full days Outpatient: 80% Co-Pay: 20% Home Health: 100% Co-Pay: none DME: 80% Co-Pay: 20% Providers: pt choice  SECONDARY: Sharen Counter Policy#: [id number] Subscriber: pt  Medicaid Application Date: Case Manager:  Disability Application Date: Case Worker:   Emergency Contact Information Contact Information    Name Relation Home Work Mobile   [place] Daughter [phone]       Current Medical History  Patient Admitting Diagnosis: Traumatic SDH  History of Present Illness:Steven Duncan is a 82 y.o. right handed male with history of HTN, DMT2, depression, HOH, frequent falls, cognitive deficits who was admitted on 06/26/2015 after mechanical fall while in the shower with positive LOC and complaints of head and neck pain. Family found patient confused and wandering around the house. CT head revealed small falcine and tentorial subdural hematoma and Dr. Bevely Palmer felt that no surgical intervention needed. Patient to follow  with NS in 3-4 weeks for follow CCT. CT cervical spine showed extensive degenerative C4- T1, post surgical changes C5 and C6, old odontoid and C1 posterior arch fractures. Therapy evaluations done yesterday revealing unsteady gait, confusion and poor safety awareness affecting mobility as well as ability to carry out ADL task.   Past Medical History  Past Medical History  Diagnosis Date  . Diabetes (HCC)   . Gait disorder   . HTN (hypertension)   . Dyslipidemia   . HOH (hard of hearing)     Family History  family history is not on file.  Prior Rehab/Hospitalizations:  Has the patient had major surgery during 100 days prior to admission? No  Current Medications   Current facility-administered medications:  . acetaminophen (TYLENOL) tablet 650 mg, 650 mg, Oral, Q4H PRN, Almond Lint, MD, 650 mg at 06/29/15 0120 . amitriptyline (ELAVIL) tablet 10 mg, 10 mg, Oral, QHS, Harriette Bouillon, MD, 10 mg at 06/30/15 2225 . citalopram (CELEXA) tablet 10 mg, 10 mg, Oral, Daily, Harriette Bouillon, MD, 10 mg at 06/30/15 2952 . hydrochlorothiazide (HYDRODIURIL) tablet 25 mg, 25 mg, Oral, Daily, Harriette Bouillon, MD, 25 mg at 06/30/15 0854 . HYDROmorphone (DILAUDID) injection 0.5 mg, 0.5 mg, Intravenous, Q4H PRN, Freeman Caldron, PA-C, 0.5 mg at 06/30/15 2225 . insulin aspart (novoLOG) injection 0-15 Units, 0-15 Units, Subcutaneous, TID WC, Harriette Bouillon, MD, 2 Units at 06/30/15 1736 . lisinopril (PRINIVIL,ZESTRIL) tablet 40 mg, 40 mg, Oral, Daily, Harriette Bouillon, MD, 40 mg at 06/30/15 0855 . metFORMIN (GLUCOPHAGE) tablet 500 mg, 500 mg, Oral, BID WC, Harriette Bouillon, MD, 500 mg at 06/30/15 1736 . metoprolol (LOPRESSOR) injection 5 mg, 5 mg, Intravenous, Q6H PRN, Almond Lint, MD, 5 mg at 06/30/15 8413 . metoprolol tartrate (LOPRESSOR) tablet 25 mg, 25 mg, Oral, BID, Almond Lint, MD, 25 mg at 06/30/15 2225 . ondansetron (ZOFRAN) tablet 4 mg, 4 mg, Oral, Q6H PRN **OR**  ondansetron (  ZOFRAN) injection 4 mg, 4 mg, Intravenous, Q6H PRN, Harriette Bouillon, MD, 4 mg at 06/30/15 1357 . potassium chloride SA (K-DUR,KLOR-CON) CR tablet 40 mEq, 40 mEq, Oral, Daily, Almond Lint, MD, 40 mEq at 06/30/15 0854 . simvastatin (ZOCOR) tablet 40 mg, 40 mg, Oral, q1800, Harriette Bouillon, MD, 40 mg at 06/30/15 1736 . sodium chloride tablet 1 g, 1 g, Oral, TID WC, Freeman Caldron, PA-C, 1 g at 06/30/15 1746 . traMADol (ULTRAM) tablet 50-100 mg, 50-100 mg, Oral, Q6H PRN, Freeman Caldron, PA-C, 50 mg at 06/30/15 1746  Patients Current Diet: Diet Carb Modified Fluid consistency:: Thin; Room service appropriate?: Yes  Precautions / Restrictions Precautions Precautions: Fall Precaution Comments: significant fall risk, very HOH Restrictions Weight Bearing Restrictions: No   Has the patient had 2 or more falls or a fall with injury in the past year?No Pt has history of infrequent falls. Does not like to use cane.  Prior Activity Level Limited Community (1-2x/wk): daughter reports pt I with adls, would not use cane, drove Showered himself and drove pta  Journalist, newspaper / Equipment Home Equipment: Cane - single point, Shower seat, Hand held shower head, Grab bars - tub/shower  Prior Device Use: Indicate devices/aids used by the patient prior to current illness, exacerbation or injury? cane  Prior Functional Level Prior Function Level of Independence: Independent with assistive device(s) Comments: uses cane for gait, reports he drives, daughter does IADL  Self Care: Did the patient need help bathing, dressing, using the toilet or eating? Independent  Indoor Mobility: Did the patient need assistance with walking from room to room (with or without device)? Independent  Stairs: Did the patient need assistance with internal or external stairs (with or without device)? Independent  Functional Cognition: Did the patient need help planning regular tasks such as  shopping or remembering to take medications? Needed some help  Current Functional Level Cognition  Arousal/Alertness: Awake/alert Overall Cognitive Status: History of cognitive impairments - at baseline Difficult to assess due to: Hard of hearing/deaf Orientation Level: Oriented X4 General Comments: Pt difficulty keeping his train of thought during conversation. Recalls events following his fall and reports being a young man.  Attention: Sustained, Selective Sustained Attention: Appears intact Selective Attention: Impaired Selective Attention Impairment: Functional complex, Verbal complex Memory: Impaired Memory Impairment: Retrieval deficit Awareness: Appears intact Problem Solving: Appears intact Safety/Judgment: Appears intact   Extremity Assessment (includes Sensation/Coordination)  Upper Extremity Assessment: RUE deficits/detail, LUE deficits/detail RUE Deficits / Details: generalized weakness, arthritic changes in hand, reports R side is his bad side, has a hx of TBI RUE Coordination: decreased fine motor LUE Deficits / Details: generalized weakness, arthritic changes in hand LUE Coordination: decreased fine motor  Lower Extremity Assessment: Defer to PT evaluation RLE Deficits / Details: per pt report he has right ankle problems. He relates them to swelling (none today), but has a steppage gait pattern on this side when walking.     ADLs  Overall ADL's : Needs assistance/impaired Eating/Feeding: Minimal assistance, Sitting Grooming: Wash/dry hands, Wash/dry face, Min guard, Sitting Upper Body Bathing: Minimal assitance, Sitting Lower Body Bathing: Maximal assistance, Sit to/from stand Upper Body Dressing : Minimal assistance, Sitting Lower Body Dressing: Maximal assistance, Sit to/from stand Toilet Transfer: Moderate assistance, Stand-pivot, BSC Toileting- Clothing Manipulation and Hygiene: Maximal assistance, Sit to/from stand Functional mobility during ADLs:  Moderate assistance, Rolling walker    Mobility  Overal bed mobility: (Pt received in recliner chair. ) Bed Mobility: Sit to Supine, Supine to  Sit Supine to sit: Mod assist Sit to supine: Mod assist General bed mobility comments: assist to raise trunk and for lifting LEs back into bed    Transfers  Overall transfer level: Needs assistance Equipment used: Rolling walker (2 wheeled) Transfers: Sit to/from Stand Sit to Stand: Mod assist Stand pivot transfers: Mod assist General transfer comment: Verbal and tactile cues for hand placement. Pt required projection from PTA to follow VCs and required cues repeated. Pt performed sit to stand from bed and commode. Pt remains to present with poor safety and problem requiring max cues to maintain safety.     Ambulation / Gait / Stairs / Wheelchair Mobility  Ambulation/Gait Ambulation/Gait assistance: Mod assist Ambulation Distance (Feet): 20 Feet (+ 70 ft. ) Assistive device: Rolling walker (2 wheeled) Gait Pattern/deviations: Step-through pattern, Steppage, Trunk flexed, Leaning posteriorly, Decreased stride length General Gait Details: Pt required cues for hand placement on RW grips, maintaining close proximity to RW and assist to turn RW. Pt required assist to improve posture. Pt responds better to hand gestures for directions.  Gait velocity: decreased Gait velocity interpretation: <1.8 ft/sec, indicative of risk for recurrent falls    Posture / Balance Balance Overall balance assessment: Needs assistance Sitting-balance support: Feet supported Sitting balance-Leahy Scale: Good Postural control: Posterior lean Standing balance support: Bilateral upper extremity supported Standing balance-Leahy Scale: Poor Standing balance comment: pt, when he stands up tall and looks up has a posterior preference in standing. He needs the support of an assistive device AND the therapist for balance in standing.     Special  needs/care consideration  Skin bruising to left neck and back of head  Bowel mgmt: continent LBM 06/30/15 Bladder mgmt:some incontinence Diabetic mgmt yes Very HOH. Has hearing aid Daughter states pt has hard time understanding Southern accent. Moved from [place] 11/23/14. Wife died 08/22/2012   Previous Home Environment Living Arrangements: Children (lives with his daughter, her 53 yo son and there is adult Futures trader) Lives With: Daughter (and her family since Nov 23, 2014) Available Help at Discharge: Family, Available PRN/intermittently Type of Home: House Home Layout: Two level, Able to live on main level with bedroom/bathroom Home Access: Stairs to enter Entrance Stairs-Rails: Right, Left, Can reach both Entrance Stairs-Number of Steps: 6 Bathroom Shower/Tub: Health visitor: Standard Bathroom Accessibility: Yes How Accessible: Accessible via walker Home Care Services: No Additional Comments: Pt very HOH, daughter clarified home layout Daughter is school teacher so gone until about 5 pm daily. 72 yo grandson in the home. Adult granddaughter lives in apartment over garage with her two year old.  Discharge Living Setting Plans for Discharge Living Setting: Lives with (comment) (daughter and her family since 11/23/14) Type of Home at Discharge: House Discharge Home Layout: Two level, Able to live on main level with bedroom/bathroom Alternate Level Stairs-Rails: Right, Left, Can reach both Discharge Home Access: Stairs to enter Entrance Stairs-Rails: Right, Left, Can reach both Entrance Stairs-Number of Steps: 6 Discharge Bathroom Shower/Tub: Walk-in shower Discharge Bathroom Toilet: Standard Discharge Bathroom Accessibility: Yes How Accessible: Accessible via walker Does the patient have any problems obtaining your medications?: No Moved from [place] 11/23/2014  Social/Family/Support Systems Patient Roles: Parent Contact Information:  Lambert Mody, daughter Anticipated Caregiver: daughter Anticipated Caregiver's Contact Information: see above Ability/Limitations of Caregiver: daughter works as a Management consultant: Evenings only (30 yo grandson in the home and another adult granddaughter i) Discharge Plan Discussed with Primary Caregiver: Yes Is Caregiver In Agreement with Plan?: Yes  Does Caregiver/Family have Issues with Lodging/Transportation while Pt is in Rehab?: No  Goals/Additional Needs Patient/Family Goal for Rehab: Mod I to supervision with PT, OT, and SLP Expected length of stay: ELOS 13-17 days Equipment Needs: VERY HOH. Has hearing aids Special Service Needs: Pt finds it hard to understand southern accent and [facility], [place] is from [place] Pt/Family Agrees to Admission and willing to participate: Yes Program Orientation Provided & Reviewed with Pt/Caregiver Including Roles & Responsibilities: Yes Daughter feels cognitively he is not far from baseline. Doubt he will need 24/7 supervision.  Decrease burden of Care through IP rehab admission: n/a  Possible need for SNF placement upon discharge:not anticipated  Patient Condition: This patient's condition remains as documented in the consult dated 06/29/2015, in which the Rehabilitation Physician determined and documented that the patient's condition is appropriate for intensive rehabilitative care in an inpatient rehabilitation facility. Will admit to inpatient rehab today.  Preadmission Screen Completed By: Clois DupesBoyette, Oralia Criger Godwin, 07/01/2015 7:37 AM ______________________________________________________________________  Discussed status with Dr. Allena KatzPatel on 07/01/2015 at [phone] and received telephone approval for admission today.  Admission Coordinator: Clois DupesBoyette, Mikella Linsley Godwin, time [id number] Date 07/01/2015          Cosigned by: Ankit Karis JubaAnil Patel, MD at 07/01/2015 7:39 AM  Revision History     Date/Time User Provider Type Action    07/01/2015 7:39 AM Ankit Karis JubaAnil Patel, MD Physician Cosign   07/01/2015 7:37 AM Standley BrookingBarbara G Artisha Capri, RN Rehab Admission Coordinator Sign

## 2015-07-01 NOTE — Progress Notes (Signed)
Patient information reviewed and entered into eRehab system by Damyon Mullane, RN, CRRN, PPS Coordinator.  Information including medical coding and functional independence measure will be reviewed and updated through discharge.     Per nursing patient was given "Data Collection Information Summary for Patients in Inpatient Rehabilitation Facilities with attached "Privacy Act Statement-Health Care Records" upon admission.  

## 2015-07-01 NOTE — Progress Notes (Signed)
Patient ID: Steven Duncan, male   DOB: Jun 15, 1928, 82 y.o.   MRN: [id number]   LOS: 5 days   Subjective: A little less sore today   Objective: Vital signs in last 24 hours: Temp:  [98 F (36.7 C)-98.8 F (37.1 C)] 98 F (36.7 C) (04/05 0528) Pulse Rate:  [60-68] 66 (04/05 0940) Resp:  [16-19] 19 (04/05 0528) BP: (135-154)/(63-90) 136/78 mmHg (04/05 0940) SpO2:  [96 %-99 %] 99 % (04/05 0528) Last BM Date: 06/30/15   Laboratory  BMET  Recent Labs  06/30/15 0501 07/01/15 0621  NA 124* 128*  K 3.5 4.9  CL 87* 88*  CO2 26 29  GLUCOSE 128* 156*  BUN 8 13  CREATININE 0.71 0.80  CALCIUM 8.4* 9.0    Physical Exam General appearance: alert and no distress Resp: clear to auscultation bilaterally Cardio: regular rate and rhythm GI: normal findings: bowel sounds normal and soft, non-tender   Assessment/Plan: Fall TBI -- No interventions per Dr. Bevely Palmeritty Multiple medical problems -- Home meds FEN -- Hyponatremia improved VTE -- SCD's Dispo -- D/C to CIR today    Freeman CaldronMichael J. Roe Koffman, PA-C Pager: [phone] General Trauma PA Pager: [phone]  07/01/2015

## 2015-07-01 NOTE — Discharge Summary (Signed)
Physician Discharge Summary  Patient ID: Steven HaddockFrank Duncan MRN: [id number] DOB/AGE: 04/29/1928 82 y.o.  Admit date: 06/26/2015 Discharge date: 07/01/2015  Discharge Diagnoses Patient Active Problem List   Diagnosis Date Noted  . Acute subdural hematoma (HCC)   . Cognitive deficit as late effect of traumatic brain injury (HCC)   . Benign essential HTN   . Type 2 diabetes mellitus without complication, without long-term current use of insulin (HCC)   . HOH (hard of hearing)   . Hyponatremia   . Acute blood loss anemia   . SDH (subdural hematoma) (HCC) 06/26/2015    Consultants Dr. Sharlet SalinaBenjamin Ditty for neurosurgery  Dr. Maryla MorrowAnkit Patel for PM&R   Procedures None   HPI: Steven Duncan fell in the bathroom and struck his head and states he was knocked unconscious. [facility] and found to have a small subdural hematoma. His Glasgow Coma Scale was 15. [facility]. Neurosurgery was consulted.   Hospital Course: Neurosurgery recommended non-operative treatment of his head injury. He developed a mild acute blood loss anemia that did not require transfusion. He also had some hyponatremia, the treatment of which was ongoing at the time of discharge. He was mobilized with the traumatic brain injury therapy team who recommended inpatient rehabilitation. They were consulted and agreed with admission. He was discharged there in good condition.   Inpatient Medications Scheduled Meds: . amitriptyline  10 mg Oral QHS  . citalopram  10 mg Oral Daily  . hydrochlorothiazide  25 mg Oral Daily  . insulin aspart  0-15 Units Subcutaneous TID WC  . lisinopril  40 mg Oral Daily  . metFORMIN  500 mg Oral BID WC  . metoprolol tartrate  25 mg Oral BID  . potassium chloride  40 mEq Oral Daily  . simvastatin  40 mg Oral q1800  . sodium chloride  1 g Oral TID WC   Continuous Infusions:  PRN Meds:.acetaminophen, HYDROmorphone  (DILAUDID) injection, metoprolol, ondansetron **OR** ondansetron (ZOFRAN) IV, traMADol  Home Medications   Medication List    TAKE these medications        amitriptyline 10 MG tablet  Commonly known as:  ELAVIL  Take 10 mg by mouth at bedtime.     aspirin 81 MG tablet  Take 81 mg by mouth daily.     citalopram 10 MG tablet  Commonly known as:  CELEXA  Take 10 mg by mouth daily.     FISH OIL + D3 PO  Take by mouth.     glucosamine-chondroitin 500-400 MG tablet  Take 1 tablet by mouth 3 (three) times daily.     glucose blood test strip  1 each by Other route as needed for other. Use as instructed     hydrochlorothiazide 25 MG tablet  Commonly known as:  HYDRODIURIL  Take 25 mg by mouth daily.     Lancets Misc  by Does not apply route.     lisinopril 40 MG tablet  Commonly known as:  PRINIVIL,ZESTRIL  Take 40 mg by mouth daily.     metFORMIN 500 MG tablet  Commonly known as:  GLUCOPHAGE  Take by mouth 2 (two) times daily with a meal.     simvastatin 40 MG tablet  Commonly known as:  ZOCOR  Take 40 mg by mouth every evening.            Follow-up Information    Follow up with  Loura Halt Ditty, MD. Schedule an appointment as soon as possible for a visit in 3 weeks.   Specialty:  Neurosurgery   Contact information:   [address] [place] [address] [phone]       Call [facility] TRAUMA SERVICE.   Why:  As needed   Contact information:   [address] [id number] mc [place] [address] [phone]       Signed: Freeman Caldron, PA-C Pager: [phone] General Trauma PA Pager: [phone] 07/01/2015, 10:47 AM

## 2015-07-01 NOTE — H&P (View-Only) (Signed)
Physical Medicine and Rehabilitation Admission H&P    Chief Complaint  Patient presents with  . Fall with TBI   HPI:   Steven Duncan is a 82 y.o. right handed male with history of HTN, DMT2, depression, HOH, frequent falls, cognitive deficits who was admitted on 06/26/2015 after mechanical fall while in the shower with positive LOC (out about an hour per patient) and complaints of head and neck pain. Family found patient confused and wandering around the house. CT head revealed small falcine and tentorial subdural hematoma and Dr. Cyndy Freeze felt that no surgical intervention needed. Patient to follow with  NS in 3-4 weeks for follow CCT.  CT cervical spine showed extensive degenerative C4- T1, post surgical changes C5 and C6, old odontoid and C1 posterior arch fractures. Therapy evaluations done yesterday revealing unsteady gait, confusion and poor safety awareness affecting mobility as well as ability to carry out ADL task. CIR recommended for follow up therapy.    Used to go to the [employer] till months ago. Now goes to [facility] for exercise. Falls frequently but "keeps cane near by but trying to get way from it"   Review of Systems  Unable to perform ROS: other  HENT: Positive for hearing loss.   Eyes: Negative for blurred vision and double vision.  Respiratory: Negative for shortness of breath.   Cardiovascular: Negative for chest pain.  Gastrointestinal: Negative for abdominal pain.  Musculoskeletal: Positive for myalgias and joint pain (right hip and left neck pain).  Neurological: Positive for weakness and headaches.  Psychiatric/Behavioral: Positive for memory loss (some difficulty recalling medical history).  All other systems reviewed and are negative.     Past Medical History  Diagnosis Date  . Diabetes ([place])   . Gait disorder   . HTN (hypertension)   . Dyslipidemia   . HOH (hard of hearing)   . Anemia   . Subdural hematoma, acute ([place]) 06/2015  . Hyponatremia 06/2015      Past Surgical History  Procedure Laterality Date  . Cervical spine surgery      C5 and C6 hemilaminectomy?  . Brain surgery  1951    "to relieve pressure from brain due to skull fractures"     History reviewed. No pertinent family history.    Social History:  Widowed. Moved to [place] from [place] last fall--used to winter here in the past. Retired from [employer] in [place]. Independent with cane and drives. Lives with daughter who's a school Pharmacist, hospital in [place]. He denies any tobacco, alcohol, and drug use.     Allergies: No Known Allergies    Medications Prior to Admission  Medication Sig Dispense Refill  . amitriptyline (ELAVIL) 10 MG tablet Take 10 mg by mouth at bedtime.    Marland Kitchen aspirin 81 MG tablet Take 81 mg by mouth daily.    . citalopram (CELEXA) 10 MG tablet Take 10 mg by mouth daily.    . Fish Oil-Cholecalciferol (FISH OIL + D3 PO) Take by mouth.    Marland Kitchen glucosamine-chondroitin 500-400 MG tablet Take 1 tablet by mouth 3 (three) times daily.    Marland Kitchen glucose blood test strip 1 each by Other route as needed for other. Use as instructed    . hydrochlorothiazide (HYDRODIURIL) 25 MG tablet Take 25 mg by mouth daily.    . Lancets MISC by Does not apply route.    Marland Kitchen lisinopril (PRINIVIL,ZESTRIL) 40 MG tablet Take 40 mg by mouth daily.    . metFORMIN (GLUCOPHAGE) 500 MG  tablet Take by mouth 2 (two) times daily with a meal.    . simvastatin (ZOCOR) 40 MG tablet Take 40 mg by mouth every evening.       Home: Home Living Family/patient expects to be discharged to:: Unsure Living Arrangements: Children Available Help at Discharge: Family, Available PRN/intermittently Type of Home: House Home Access: Stairs to enter [employer] of Steps: 6 Entrance Stairs-Rails: Right, Left, Can reach both Home Layout: Two level, Able to live on main level with bedroom/bathroom Bathroom Shower/Tub: Multimedia programmer: Standard Bathroom Accessibility: Yes Home Equipment: Cane -  single point, Shower seat, Hand held shower head, Grab bars - tub/shower Additional Comments: Pt very HOH, daughter clarified home layout  Lives With: Daughter (and her family since August 2016)   Functional History: Prior Function Level of Independence: Independent with assistive device(s) Comments: uses cane for gait, reports he drives, daughter does IADL  Functional Status:  Mobility: Bed Mobility Overal bed mobility:  (Pt received in recliner chair.  ) Bed Mobility: Sit to Supine, Supine to Sit Supine to sit: Mod assist Sit to supine: Mod assist General bed mobility comments: assist to raise trunk and for lifting LEs back into bed Transfers Overall transfer level: Needs assistance Equipment used: Rolling walker (2 wheeled) Transfers: Sit to/from Stand Sit to Stand: Mod assist Stand pivot transfers: Mod assist General transfer comment: Verbal and tactile cues for hand placement.  Pt required projection from PTA to follow VCs and required cues repeated.  Pt performed sit to stand from bed and commode.  Pt remains to present with poor safety and problem requiring max cues to maintain safety.   Ambulation/Gait Ambulation/Gait assistance: Mod assist Ambulation Distance (Feet): 20 Feet (+ 70 ft.  ) Assistive device: Rolling walker (2 wheeled) Gait Pattern/deviations: Step-through pattern, Steppage, Trunk flexed, Leaning posteriorly, Decreased stride length General Gait Details: Pt required cues for hand placement on RW grips, maintaining close proximity to RW and assist to turn RW.  Pt required assist to improve posture.  Pt responds better to hand gestures for directions.   Gait velocity: decreased Gait velocity interpretation: <1.8 ft/sec, indicative of risk for recurrent falls    ADL: ADL Overall ADL's : Needs assistance/impaired Eating/Feeding: Minimal assistance, Sitting Grooming: Wash/dry hands, Wash/dry face, Min guard, Sitting Upper Body Bathing: Minimal assitance,  Sitting Lower Body Bathing: Maximal assistance, Sit to/from stand Upper Body Dressing : Minimal assistance, Sitting Lower Body Dressing: Maximal assistance, Sit to/from stand Toilet Transfer: Moderate assistance, Stand-pivot, BSC Toileting- Clothing Manipulation and Hygiene: Maximal assistance, Sit to/from stand Functional mobility during ADLs: Moderate assistance, Rolling walker  Cognition: Cognition Overall Cognitive Status: History of cognitive impairments - at baseline Arousal/Alertness: Awake/alert Orientation Level: Oriented X4 Attention: Sustained, Selective Sustained Attention: Appears intact Selective Attention: Impaired Selective Attention Impairment: Functional complex, Verbal complex Memory: Impaired Memory Impairment: Retrieval deficit Awareness: Appears intact Problem Solving: Appears intact Safety/Judgment: Appears intact Cognition Arousal/Alertness: Awake/alert Behavior During Therapy: WFL for tasks assessed/performed Overall Cognitive Status: History of cognitive impairments - at baseline Area of Impairment: Memory Memory: Decreased short-term memory General Comments: Pt difficulty keeping his train of thought during conversation. Recalls events following his fall and reports being a young man.   Difficult to assess due to: Hard of hearing/deaf   Blood pressure 136/78, pulse 66, temperature 98 F (36.7 C), temperature source Oral, resp. rate 19, height '5\' 5"'  (1.651 m), weight 70 kg (154 lb 5.2 oz), SpO2 99 %. Physical Exam  Nursing note and vitals reviewed.  Constitutional: He is oriented to person, place, and time. He appears well-developed and well-nourished. No distress.  HENT:  Head: Normocephalic and atraumatic.  Mouth/Throat: Oropharynx is clear and moist.  Eyes: Conjunctivae are normal. Pupils are equal, round, and reactive to light.  Neck: Normal range of motion. Neck supple.  Well healed old posterior neck incision.  Ecchymosis and tender abraded  area on left post auricular and left neck.   Cardiovascular: Normal rate and regular rhythm.   Respiratory: Effort normal and breath sounds normal. No stridor.  GI: Soft. Bowel sounds are normal. He exhibits no distension. There is no tenderness.  Musculoskeletal: He exhibits no edema or tenderness.  Tight heel cord right ankle (due to prior injury)  Neurological: He is alert and oriented to person, place, and time.  Extremely HOH despite hearing aids.  Alert and makes good eye contact with examiner.  Exam limited as patient is extremely hard of hearing with hearing aid in place.  Motor: B/l UE: 4+/5 proximal to distal B/l LE 4+/5 proximal to distal Sensation intact to light touch DTRs 3+ left side  Skin: Skin is warm. No rash noted. He is not diaphoretic. No erythema.  Psychiatric: He has a normal mood and affect. Thought content normal.  Inappropriate laughter    Results for orders placed or performed during the hospital encounter of 06/26/15 (from the past 48 hour(s))  Comprehensive metabolic panel     Status: Abnormal   Collection Time: 06/29/15 11:02 AM  Result Value Ref Range   Sodium 126 (L) 135 - 145 mmol/L   Potassium 3.5 3.5 - 5.1 mmol/L   Chloride 89 (L) 101 - 111 mmol/L   CO2 28 22 - 32 mmol/L   Glucose, Bld 127 (H) 65 - 99 mg/dL   BUN 9 6 - 20 mg/dL   Creatinine, Ser 0.72 0.61 - 1.24 mg/dL   Calcium 8.2 (L) 8.9 - 10.3 mg/dL   Total Protein 6.1 (L) 6.5 - 8.1 g/dL   Albumin 3.2 (L) 3.5 - 5.0 g/dL   AST 22 15 - 41 U/L   ALT 16 (L) 17 - 63 U/L   Alkaline Phosphatase 36 (L) 38 - 126 U/L   Total Bilirubin 1.1 0.3 - 1.2 mg/dL   GFR calc non Af Amer >60 >60 mL/min   GFR calc Af Amer >60 >60 mL/min    Comment: (NOTE) The eGFR has been calculated using the CKD EPI equation. This calculation has not been validated in all clinical situations. eGFR's persistently <60 mL/min signify possible Chronic Kidney Disease.    Anion gap 9 5 - 15  Glucose, capillary     Status:  Abnormal   Collection Time: 06/29/15 12:34 PM  Result Value Ref Range   Glucose-Capillary 112 (H) 65 - 99 mg/dL  Glucose, capillary     Status: Abnormal   Collection Time: 06/29/15  4:56 PM  Result Value Ref Range   Glucose-Capillary 147 (H) 65 - 99 mg/dL  Glucose, capillary     Status: Abnormal   Collection Time: 06/29/15  9:17 PM  Result Value Ref Range   Glucose-Capillary 120 (H) 65 - 99 mg/dL  Basic metabolic panel     Status: Abnormal   Collection Time: 06/30/15  5:01 AM  Result Value Ref Range   Sodium 124 (L) 135 - 145 mmol/L   Potassium 3.5 3.5 - 5.1 mmol/L   Chloride 87 (L) 101 - 111 mmol/L   CO2 26 22 - 32 mmol/L   Glucose,  Bld 128 (H) 65 - 99 mg/dL   BUN 8 6 - 20 mg/dL   Creatinine, Ser 0.71 0.61 - 1.24 mg/dL   Calcium 8.4 (L) 8.9 - 10.3 mg/dL   GFR calc non Af Amer >60 >60 mL/min   GFR calc Af Amer >60 >60 mL/min    Comment: (NOTE) The eGFR has been calculated using the CKD EPI equation. This calculation has not been validated in all clinical situations. eGFR's persistently <60 mL/min signify possible Chronic Kidney Disease.    Anion gap 11 5 - 15  Glucose, capillary     Status: Abnormal   Collection Time: 06/30/15  8:03 AM  Result Value Ref Range   Glucose-Capillary 117 (H) 65 - 99 mg/dL  Glucose, capillary     Status: Abnormal   Collection Time: 06/30/15 12:11 PM  Result Value Ref Range   Glucose-Capillary 124 (H) 65 - 99 mg/dL  Glucose, capillary     Status: Abnormal   Collection Time: 06/30/15  5:09 PM  Result Value Ref Range   Glucose-Capillary 147 (H) 65 - 99 mg/dL  Glucose, capillary     Status: Abnormal   Collection Time: 06/30/15  9:37 PM  Result Value Ref Range   Glucose-Capillary 155 (H) 65 - 99 mg/dL  Basic metabolic panel     Status: Abnormal   Collection Time: 07/01/15  6:21 AM  Result Value Ref Range   Sodium 128 (L) 135 - 145 mmol/L   Potassium 4.9 3.5 - 5.1 mmol/L   Chloride 88 (L) 101 - 111 mmol/L   CO2 29 22 - 32 mmol/L   Glucose,  Bld 156 (H) 65 - 99 mg/dL   BUN 13 6 - 20 mg/dL   Creatinine, Ser 0.80 0.61 - 1.24 mg/dL   Calcium 9.0 8.9 - 10.3 mg/dL   GFR calc non Af Amer >60 >60 mL/min   GFR calc Af Amer >60 >60 mL/min    Comment: (NOTE) The eGFR has been calculated using the CKD EPI equation. This calculation has not been validated in all clinical situations. eGFR's persistently <60 mL/min signify possible Chronic Kidney Disease.    Anion gap 11 5 - 15  Glucose, capillary     Status: Abnormal   Collection Time: 07/01/15  7:59 AM  Result Value Ref Range   Glucose-Capillary 128 (H) 65 - 99 mg/dL   Comment 1 Notify RN    No results found.     Medical Problem List and Plan: 1.  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 2.  DVT Prophylaxis/Anticoagulation: Mechanical: Sequential compression devices, below knee Bilateral lower extremities 3. Pain Management: Tylenol prn pain. Ice and heat for MS discomfort.  4. Mood: LCSW to follow for evaluation and support.  5. Neuropsych: This patient is not capable of making decisions on his own behalf. 6. Skin/Wound Care: Routine pressure relief measures.  7. Fluids/Electrolytes/Nutrition: Monitor I/O. Check lytes in am. Start fluid restriction.  8.  DMT2: Monitor BS with ac/hs checks. Continue metformin bid 9. HTN: Monitor bid.  Continue Prinivil and metoprolol--hold HCTZ. Check orthostatic BP with history of frequent falls 10 Cerebral salt wasting disease: Hyponatremia likely due to TBI. Will place patient on salt tabs as well as 1200 cc fluid restrictions. Hold HCTZ and decrease Celexa to 5 mg daily.  Monitor for MS changes and/or seizures.  11. Cognitive deficits: History of TBI as a teenager.  12.  Dyslipidemia: On zocor.    Post Admission Physician Evaluation:  1. Functional deficits secondary  to traumatic subdural hematoma. 2. Patient is admitted to receive collaborative, interdisciplinary care between the  physiatrist, rehab nursing staff, and therapy team. 3. Patient's level of medical complexity and substantial therapy needs in context of that medical necessity cannot be provided at a lesser intensity of care such as a SNF. 4. Patient has experienced substantial functional loss from his/her baseline which was documented above under the "Functional History" and "Functional Status" headings.  Judging by the patient's diagnosis, physical exam, and functional history, the patient has potential for functional progress which will result in measurable gains while on inpatient rehab.  These gains will be of substantial and practical use upon discharge  in facilitating mobility and self-care at the household level. 5. Physiatrist will provide 24 hour management of medical needs as well as oversight of the therapy plan/treatment and provide guidance as appropriate regarding the interaction of the two. 6. 24 hour rehab nursing will assist with bladder management, safety, skin/wound care, disease management, medication administration and patient education and help integrate therapy concepts, techniques,education, etc. 7. PT will assess and treat for/with: Lower extremity strength, range of motion, stamina, balance, functional mobility, safety, adaptive techniques and equipment, woundcare, coping skills, pain control, education.   Goals are:  Supervision. 8. OT will assess and treat for/with: ADL's, functional mobility, safety, upper extremity strength, adaptive techniques and equipment, wound mgt, ego support, and community reintegration.   Goals are:  Supervision. Therapy  may proceed with showering this patient. 9. SLP will assess and treat for/with: cognition.  Goals are:  Supervision/min assist. 10. Case Management and Social Worker will assess and treat for psychological issues and discharge planning. 11. Team conference will be held weekly to assess progress toward goals and to determine barriers to  discharge. 12. Patient will receive at least 3 hours of therapy per day at least 5 days per week. 13. ELOS:  12-15 days.       14. Prognosis:  excellent and good   Delice Lesch, MD  07/01/2015

## 2015-07-01 NOTE — Progress Notes (Signed)
I will contact Trauma PA to make arranagements to admit pt to inpt rehab today. Bedside RN, Dorathy DaftKayla is aware. I will notify RN CM and SW also. [phone]

## 2015-07-01 NOTE — Progress Notes (Signed)
Patient ID: Steven HaddockFrank Goren, male   DOB: February 04, 1929, 82 y.o.   MRN: [id number] Patient admitted to [phone] via bed, escorted by nursing staff.  Patient oriented to unit best as possible due to hearing deficits.  Patient appears in no distress at this time.  Dani Gobbleeardon, Takyra Cantrall J, RN

## 2015-07-01 NOTE — Progress Notes (Signed)
Ankit Karis Juba, MD Physician Signed Physical Medicine and Rehabilitation Consult Note 06/29/2015 1:18 PM  Related encounter: ED to Hosp-Admission (Current) from 06/26/2015 in [facility] 5W MEDICAL    Expand All Collapse All        Physical Medicine and Rehabilitation Consult Reason for Consult: Traumatic subdural hematoma Referring Physician: Trauma   HPI: Steven Duncan is a 82 y.o. right handed male with history of hypertension, diabetes mellitus, extremely hard of hearing and question cognitive deficits. Patient was with daughter son-in-law and grandchild. Used a single-point cane prior to admission. Admitted 06/26/2015 after mechanical fall while in the shower. Positive loss of consciousness. Complains of headache. CT scan imaging revealed small falcine and tentorial subdural hematoma. No significant midline shift. CT cervical spine negative for fracture. Neurosurgery consulted and advised conservative care. Tolerating a regular consistency diet. Physical therapy evaluation completed 06/29/2015 with recommendations of physical medicine rehabilitation consult.   Review of Systems  Constitutional: Negative for fever and chills.  HENT: Positive for hearing loss.  Eyes: Negative for blurred vision and double vision.  Respiratory: Negative for cough and shortness of breath.  Cardiovascular: Negative for chest pain, palpitations and leg swelling.  Gastrointestinal: Positive for constipation. Negative for nausea and vomiting.  Genitourinary: Positive for urgency. Negative for dysuria and hematuria.  Musculoskeletal: Positive for myalgias and joint pain.  Skin: Negative for rash.  Neurological: Positive for loss of consciousness, weakness and headaches. Negative for seizures.  Psychiatric/Behavioral: Positive for depression and memory loss.  All other systems reviewed and are negative.  No past medical history on file. No past surgical history on file. No family history  on file. Social History:  has no tobacco, alcohol, and drug history on file. Allergies: No Known Allergies Medications Prior to Admission  Medication Sig Dispense Refill  . amitriptyline (ELAVIL) 10 MG tablet Take 10 mg by mouth at bedtime.    Marland Kitchen aspirin 81 MG tablet Take 81 mg by mouth daily.    . citalopram (CELEXA) 10 MG tablet Take 10 mg by mouth daily.    . Fish Oil-Cholecalciferol (FISH OIL + D3 PO) Take by mouth.    Marland Kitchen glucosamine-chondroitin 500-400 MG tablet Take 1 tablet by mouth 3 (three) times daily.    Marland Kitchen glucose blood test strip 1 each by Other route as needed for other. Use as instructed    . hydrochlorothiazide (HYDRODIURIL) 25 MG tablet Take 25 mg by mouth daily.    . Lancets MISC by Does not apply route.    Marland Kitchen lisinopril (PRINIVIL,ZESTRIL) 40 MG tablet Take 40 mg by mouth daily.    . metFORMIN (GLUCOPHAGE) 500 MG tablet Take by mouth 2 (two) times daily with a meal.    . simvastatin (ZOCOR) 40 MG tablet Take 40 mg by mouth every evening.       Home: Home Living Family/patient expects to be discharged to:: Private residence Living Arrangements: Children, Other relatives (daughter, son in law and grandchild) Available Help at Discharge: Family, Available PRN/intermittently (family works) Type of Home: House Home Equipment: Gilmer Mor - single point Additional Comments: Difficult hx as pt is very HOH vs has some mild cognitive deficits  Functional History: Prior Function Level of Independence: Independent with assistive device(s) Comments: uses cane for gait Functional Status:  Mobility: Bed Mobility Overal bed mobility: Needs Assistance Bed Mobility: Sit to Supine Sit to supine: Mod assist General bed mobility comments: Mod assist to lift both legs up into the bed when going to supine Transfers Overall  transfer level: Needs assistance Equipment used: Rolling walker (2 wheeled), Straight cane Transfers: Sit  to/from Stand Sit to Stand: Mod assist General transfer comment: Mod assist to support trunk during transition to stand. Started standing with cane and quickly realized that this would not be enough support for him during gait and switched to a RW instead. Verbal cues for safe hand placement.  Ambulation/Gait Ambulation/Gait assistance: Mod assist Ambulation Distance (Feet): 70 Feet Assistive device: Rolling walker (2 wheeled) Gait Pattern/deviations: Step-through pattern, Steppage, Trunk flexed General Gait Details: Pt with right leg steppage gait pattern and right hand slipping off of RW during gait (maybe PMHx of stroke--nothing listed in the chart). I attempted to ask pt about his gait pattern, but due to significant hearing deficits could not drill down to the cause of his right leg steppage gait pattern. Pt fatigued very quickly and needed mod assist for balance and to help steer RW  Gait velocity: decreased Gait velocity interpretation: <1.8 ft/sec, indicative of risk for recurrent falls    ADL:    Cognition: Cognition Overall Cognitive Status: Impaired/Different from baseline Orientation Level: Oriented X4 Cognition Arousal/Alertness: Awake/alert Behavior During Therapy: WFL for tasks assessed/performed Overall Cognitive Status: Impaired/Different from baseline Area of Impairment: Memory Memory: Decreased short-term memory General Comments: Pt reports when he first woke up after hitting his head that he was looking for his wife (who is deceased). He often at times interchanged speaking of his wife/daughter during our conversations.  Difficult to assess due to: Hard of hearing/deaf  Blood pressure 165/78, pulse 64, temperature 98.1 F (36.7 C), temperature source Oral, resp. rate 16, height 5\' 5"  (1.651 m), weight 70 kg (154 lb 5.2 oz), SpO2 99 %. Physical Exam  Vitals reviewed. Constitutional: He is oriented to person, place, and time. He appears well-developed and  well-nourished.  HENT:  Head: Normocephalic.  Right Ear: External ear normal.  Left Ear: External ear normal.  Eyes: Conjunctivae and EOM are normal.  Neck: Normal range of motion. Neck supple. No thyromegaly present.  Cardiovascular: Normal rate and regular rhythm.  Respiratory: Effort normal and breath sounds normal. No respiratory distress.  GI: Soft. Bowel sounds are normal. He exhibits no distension.  Musculoskeletal: He exhibits no edema or tenderness.  Neurological: He is alert and oriented to person, place, and time.  Alert and makes good eye contact with examiner.  Exam limited as patient is extremely hard of hearing with hearing aid in place.  He does follow simple demonstrated commands.  Motor: B/l UE: 4+/5 proximal to distal B/l LE 4+/5 proximal to distal Sensation intact to light touch DTRs 3+ left side HOH  Skin: Skin is warm and dry.  Psychiatric: His speech is normal.  Inappropriate laughter     Lab Results Last 24 Hours    Results for orders placed or performed during the hospital encounter of 06/26/15 (from the past 24 hour(s))  Glucose, capillary Status: Abnormal   Collection Time: 06/28/15 5:01 PM  Result Value Ref Range   Glucose-Capillary 160 (H) 65 - 99 mg/dL   Comment 1 Notify RN    Comment 2 Document in Chart   Glucose, capillary Status: Abnormal   Collection Time: 06/28/15 11:25 PM  Result Value Ref Range   Glucose-Capillary 127 (H) 65 - 99 mg/dL  Glucose, capillary Status: Abnormal   Collection Time: 06/29/15 8:24 AM  Result Value Ref Range   Glucose-Capillary 131 (H) 65 - 99 mg/dL  Comprehensive metabolic panel Status: Abnormal   Collection Time:  06/29/15 11:02 AM  Result Value Ref Range   Sodium 126 (L) 135 - 145 mmol/L   Potassium 3.5 3.5 - 5.1 mmol/L   Chloride 89 (L) 101 - 111 mmol/L   CO2 28 22 - 32 mmol/L   Glucose, Bld 127 (H) 65 - 99 mg/dL   BUN  9 6 - 20 mg/dL   Creatinine, Ser 1.61 0.61 - 1.24 mg/dL   Calcium 8.2 (L) 8.9 - 10.3 mg/dL   Total Protein 6.1 (L) 6.5 - 8.1 g/dL   Albumin 3.2 (L) 3.5 - 5.0 g/dL   AST 22 15 - 41 U/L   ALT 16 (L) 17 - 63 U/L   Alkaline Phosphatase 36 (L) 38 - 126 U/L   Total Bilirubin 1.1 0.3 - 1.2 mg/dL   GFR calc non Af Amer >60 >60 mL/min   GFR calc Af Amer >60 >60 mL/min   Anion gap 9 5 - 15  Glucose, capillary Status: Abnormal   Collection Time: 06/29/15 12:34 PM  Result Value Ref Range   Glucose-Capillary 112 (H) 65 - 99 mg/dL      Imaging Results (Last 48 hours)    No results found.    Assessment/Plan: Diagnosis: Traumatic subdural hematoma Labs and images independently reviewed. Records reviewed and summated above. [place] score: >/7 Speech to evaluate for Post traumatic amnesia and interval GOAT scores to assess progress. NeuroPsych evaluation for behavorial assessment. Address behavioral concerns include providing structured environments and daily routines. Cognitive therapy to direct modular abilities in order to maintain goals including problem solving, self regulation/monitoring, self management, attention, and memory. Fall precautions; pt at risk for second impact syndrome Prevention of secondary injury: monitor for hypotension, hypoxia, seizures or signs of increased ICP Prophylactic AED:  Consider pharmacological intervention if necessary with neurostimulants, Such as amantadine, methylphenidate, modafinil, etc. Avoid medications that could impair cognitive abilities, such as anticholinergics, antihistaminic, benzodiazapines, narcotics, etc when possible  1. Does the need for close, 24 hr/day medical supervision in concert with the patient's rehab needs make it unreasonable  for this patient to be served in a less intensive setting? Yes  2. Co-Morbidities requiring supervision/potential complications: HTN (monitor and provide prns in accordance with increased physical exertion and pain), diabetes mellitus (Monitor in accordance with exercise and adjust meds as necessary), extremely hard of hearing (inspite of hearing aids), ?cognitive deficits (SLP eval), headache (cont to treat to ensure pain does not limit functional progress), hyponatremia (cont to monitor, consider repletion if necessary), ABLA (transfuse if necessary to ensure appropriate perfusion for increased activity tolerance) 3. Due to bladder management, safety, skin/wound care, disease management, medication administration and patient education, does the patient require 24 hr/day rehab nursing? Yes 4. Does the patient require coordinated care of a physician, rehab nurse, PT (1-2 hrs/day, 5 days/week), OT (1-2 hrs/day, 5 days/week) and SLP (1-2 hrs/day, 5 days/week) to address physical and functional deficits in the context of the above medical diagnosis(es)? Yes Addressing deficits in the following areas: balance, endurance, locomotion, strength, transferring, bowel/bladder control, bathing, toileting, cognition and psychosocial support 5. Can the patient actively participate in an intensive therapy program of at least 3 hrs of therapy per day at least 5 days per week? Yes 6. The potential for patient to make measurable gains while on inpatient rehab is excellent 7. Anticipated functional outcomes upon discharge from inpatient rehab are supervision with PT, supervision with OT, supervision and min assist with SLP. 8. Estimated rehab length of stay to reach the above  functional goals is: 13-17 days. 9. Does the patient have adequate social supports and living environment to accommodate these discharge functional goals? Yes 10. Anticipated D/C setting: Home 11. Anticipated post D/C treatments: HH therapy and Home  excercise program 12. Overall Rehab/Functional Prognosis: excellent  RECOMMENDATIONS: This patient's condition is appropriate for continued rehabilitative care in the following setting: CIR when medically stable Patient has agreed to participate in recommended program. Yes Note that insurance prior authorization may be required for reimbursement for recommended care.  Comment: Rehab Admissions Coordinator to follow up.  Maryla Morrow, MD 06/29/2015       Revision History     Date/Time User Provider Type Action   06/29/2015 2:59 PM Ankit Karis Juba, MD Physician Sign   06/29/2015 1:29 PM Charlton Amor, PA-C Physician Assistant [place]   View Details Report       Routing History     Date/Time From To Method   06/29/2015 2:59 PM Ankit Karis Juba, MD No Pcp Per Patient In Basket

## 2015-07-01 NOTE — Interval H&P Note (Signed)
Steven Duncan was admitted today to Inpatient Rehabilitation with the diagnosis of  traumatic subdural hematoma.  The patient's history has been reviewed, patient examined, and there is no change in status.  Patient continues to be appropriate for intensive inpatient rehabilitation.  I have reviewed the patient's chart and labs.  Questions were answered to the patient's satisfaction. The PAPE has been reviewed and assessment remains appropriate.  Steven Duncan 07/01/2015, 1:30 PM

## 2015-07-02 ENCOUNTER — Inpatient Hospital Stay (HOSPITAL_COMMUNITY): Payer: Medicare Other | Admitting: Physical Therapy

## 2015-07-02 ENCOUNTER — Inpatient Hospital Stay (HOSPITAL_COMMUNITY): Payer: Medicare Other | Admitting: Speech Pathology

## 2015-07-02 ENCOUNTER — Inpatient Hospital Stay (HOSPITAL_COMMUNITY): Payer: Medicare Other | Admitting: Occupational Therapy

## 2015-07-02 LAB — CBC WITH DIFFERENTIAL/PLATELET
BASOS PCT: 0 %
Basophils Absolute: 0 10*3/uL (ref 0.0–0.1)
EOS ABS: 0.1 10*3/uL (ref 0.0–0.7)
Eosinophils Relative: 1 %
HEMATOCRIT: 32.9 % — AB (ref 39.0–52.0)
HEMOGLOBIN: 11.3 g/dL — AB (ref 13.0–17.0)
LYMPHS ABS: 1.7 10*3/uL (ref 0.7–4.0)
Lymphocytes Relative: 32 %
MCH: 29.2 pg (ref 26.0–34.0)
MCHC: 34.3 g/dL (ref 30.0–36.0)
MCV: 85 fL (ref 78.0–100.0)
MONO ABS: 1.4 10*3/uL — AB (ref 0.1–1.0)
MONOS PCT: 25 %
NEUTROS PCT: 42 %
Neutro Abs: 2.2 10*3/uL (ref 1.7–7.7)
Platelets: 214 10*3/uL (ref 150–400)
RBC: 3.87 MIL/uL — ABNORMAL LOW (ref 4.22–5.81)
RDW: 13.1 % (ref 11.5–15.5)
WBC: 5.4 10*3/uL (ref 4.0–10.5)

## 2015-07-02 LAB — COMPREHENSIVE METABOLIC PANEL
ALBUMIN: 3.1 g/dL — AB (ref 3.5–5.0)
ALK PHOS: 43 U/L (ref 38–126)
ALT: 16 U/L — AB (ref 17–63)
AST: 21 U/L (ref 15–41)
Anion gap: 12 (ref 5–15)
BUN: 12 mg/dL (ref 6–20)
CALCIUM: 8.8 mg/dL — AB (ref 8.9–10.3)
CO2: 26 mmol/L (ref 22–32)
CREATININE: 0.8 mg/dL (ref 0.61–1.24)
Chloride: 88 mmol/L — ABNORMAL LOW (ref 101–111)
GFR calc Af Amer: >60 mL/min (ref >60)
GFR calc non Af Amer: >60 mL/min (ref >60)
GLUCOSE: 139 mg/dL — AB (ref 65–99)
Potassium: 3.7 mmol/L (ref 3.5–5.1)
Sodium: 126 mmol/L — ABNORMAL LOW (ref 135–145)
Total Bilirubin: 0.8 mg/dL (ref 0.3–1.2)
Total Protein: 6.2 g/dL — ABNORMAL LOW (ref 6.5–8.1)

## 2015-07-02 LAB — GLUCOSE, CAPILLARY
GLUCOSE-CAPILLARY: 125 mg/dL — AB (ref 65–99)
GLUCOSE-CAPILLARY: 98 mg/dL (ref 65–99)
Glucose-Capillary: 135 mg/dL — ABNORMAL HIGH (ref 65–99)
Glucose-Capillary: 134 mg/dL — ABNORMAL HIGH (ref 65–99)
Glucose-Capillary: 120 mg/dL — ABNORMAL HIGH (ref 65–99)

## 2015-07-02 NOTE — Evaluation (Signed)
Speech Language Pathology Assessment and Plan  Patient Details  Name: Steven Duncan MRN: [id number] Date of Birth: 04/14/1928  SLP Diagnosis: Cognitive Impairments  Rehab Potential: Good ELOS: 12-14 days     Today's Date: 07/02/2015 SLP Individual Time: 1300-1400 SLP Individual Time Calculation (min): 60 min   Problem List:  Patient Active Problem List   Diagnosis Date Noted  . TBI (traumatic brain injury) ([place]) 07/01/2015  . 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 Easy fatigability   . Abnormality of gait   . Type 2 diabetes mellitus with complication, without long-term current use of insulin ([place])   . Cerebral salt-wasting   . HLD (hyperlipidemia)   . Acute subdural hematoma ([facility])   . Cognitive deficit as late effect of traumatic brain injury ([place])   . Benign essential HTN   . Type 2 diabetes mellitus without complication, without long-term current use of insulin ([place])   . HOH (hard of hearing)   . Hyponatremia   . Acute blood loss anemia   . SDH (subdural hematoma) ([place]) 06/26/2015   Past Medical History:  Past Medical History  Diagnosis Date  . Diabetes ([place])   . Gait disorder   . HTN (hypertension)   . Dyslipidemia   . HOH (hard of hearing)   . Anemia   . Subdural hematoma, acute ([place]) 06/2015  . Hyponatremia 06/2015   Past Surgical History:  Past Surgical History  Procedure Laterality Date  . Cervical spine surgery      C5 and C6 hemilaminectomy?  . Brain surgery  1951    "to relieve pressure from brain due to skull fractures"    Assessment / Plan / Recommendation Clinical Impression Patient is an 82 y.o. right handed male with history of HTN, DMT2, depression, HOH, frequent falls, cognitive deficits who was admitted on 06/26/2015 after mechanical fall while in the shower with positive LOC (out about an hour per patient) and complaints of head and neck pain. Family found patient confused and wandering around the house. CT head  revealed small falcine and tentorial subdural hematoma and Dr. Cyndy Freeze felt that no surgical intervention needed. Patient to follow with NS in 3-4 weeks for follow CCT.  CT cervical spine showed extensive degenerative C4- T1, post-surgical changes C5 and C6, old odontoid and C1 posterior arch fractures. Therapy evaluations done yesterday revealing unsteady gait, confusion and poor safety awareness affecting mobility as well as ability to carry out ADL tasks. CIR recommended for follow up therapy and patient admitted 07/01/15. Patient demonstrates behaviors consistent with a [place] Level [place] characterized by impaired alternating attention, functional problem solving, emergent awareness, recall and overall safety with functional and familiar tasks. Patient can also be tangential throughout functional conversations. Patient would benefit from skilled SLP intervention to maximize his cognitive function and overall functional independence prior to discharge home.    Skilled Therapeutic Interventions          Administered a cognitive-linguistic evaluation. Please see above for details. Educated the patient in regards to his cognitive deficits and goals of skilled SLP intervention. He verbalized understanding.   SLP Assessment  Patient will need skilled [place] [facility] Services during CIR admission    Recommendations  Oral Care Recommendations: Oral care BID Recommendations for Other Services: Neuropsych consult Patient destination: Home Follow up Recommendations: 24 hour supervision/assistance;Home Health SLP;Outpatient SLP Equipment Recommended: None recommended by SLP    SLP Frequency 3 to 5 out of 7 days   SLP  Duration  SLP Intensity  SLP Treatment/Interventions 12-14 days   Minumum of 1-2 x/day, 30 to 90 minutes  Cognitive remediation/compensation;Cueing hierarchy;Functional tasks;Environmental controls;Internal/external aids;Patient/family education;Therapeutic Activities     Pain No/Denies Pain   Function:  Eating Eating   Modified Consistency Diet: No Eating Assist Level: Set up assist for;Assistive Device Eating Assistive Device Comment: red built up gripper  Eating Set Up Assist For: Opening containers;Cutting food;Applying device (includes dentures)       Cognition Comprehension Comprehension assist level: Follows basic conversation/direction with extra time/assistive device (very HOH but comprehension intact when he can hear what the person is asking )  Expression   Expression assist level: Expresses basic 90% of the time/requires cueing < 10% of the time.  Social Interaction Social Interaction assist level: Interacts appropriately 75 - 89% of the time - Needs redirection for appropriate language or to initiate interaction.  Problem Solving Problem solving assist level: Solves basic 75 - 89% of the time/requires cueing 10 - 24% of the time  Memory Memory assist level: Recognizes or recalls 75 - 89% of the time/requires cueing 10 - 24% of the time   Short Term Goals: Week 1: SLP Short Term Goal 1 (Week 1): Patient will demonstrate alternating attention between functional tasks for 10 minutes with supervision verbal cues for redirection.  SLP Short Term Goal 2 (Week 1): Patient will demonstrate functional problem solving for mildly complex tasks with supervision verbal cues.  SLP Short Term Goal 3 (Week 1): Patient will self-monitor and correct tangents during functional conversations with supervision verbal cues.  SLP Short Term Goal 4 (Week 1): Patient will demonstrate recall of daily information with supervision verbal and visual cues.   Refer to Care Plan for Long Term Goals  Recommendations for other services: Neuropsych  Discharge Criteria: Patient will be discharged from SLP if patient refuses treatment 3 consecutive times without medical reason, if treatment goals not met, if there is a change in medical status, if patient makes no progress  towards goals or if patient is discharged from hospital.  The above assessment, treatment plan, treatment alternatives and goals were discussed and mutually agreed upon: by patient  Evaluna Utke 07/02/2015, 4:57 PM

## 2015-07-02 NOTE — Evaluation (Signed)
Occupational Therapy Assessment and Plan  Patient Details  Name: Steven Duncan MRN: [id number] Date of Birth: 09-23-1928  OT Diagnosis: abnormal posture, cognitive deficits and muscle weakness (generalized) Rehab Potential: Rehab Potential (ACUTE ONLY): Good ELOS: 12-14 days   Today's Date: 07/02/2015 OT Individual Time: 0800-0900 OT Individual Time Calculation (min): 60 min     Problem List:  Patient Active Problem List   Diagnosis Date Noted  . TBI (traumatic brain injury) ([place]) 07/01/2015  . 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 Easy fatigability   . Abnormality of gait   . Type 2 diabetes mellitus with complication, without long-term current use of insulin ([place])   . Cerebral salt-wasting   . HLD (hyperlipidemia)   . Acute subdural hematoma ([facility])   . Cognitive deficit as late effect of traumatic brain injury ([place])   . Benign essential HTN   . Type 2 diabetes mellitus without complication, without long-term current use of insulin ([place])   . HOH (hard of hearing)   . Hyponatremia   . Acute blood loss anemia   . SDH (subdural hematoma) ([place]) 06/26/2015    Past Medical History:  Past Medical History  Diagnosis Date  . Diabetes ([place])   . Gait disorder   . HTN (hypertension)   . Dyslipidemia   . HOH (hard of hearing)   . Anemia   . Subdural hematoma, acute ([place]) 06/2015  . Hyponatremia 06/2015   Past Surgical History:  Past Surgical History  Procedure Laterality Date  . Cervical spine surgery      C5 and C6 hemilaminectomy?  . Brain surgery  1951    "to relieve pressure from brain due to skull fractures"    Assessment & Plan Clinical Impression: Steven Duncan is a 82 y.o. right handed male with history of HTN, DMT2, depression, HOH, frequent falls, cognitive deficits who was admitted on 06/26/2015 after mechanical fall while in the shower with positive LOC (out about an hour per patient) and complaints of head and neck pain. Family found  patient confused and wandering around the house. CT head revealed small falcine and tentorial subdural hematoma and Dr. Cyndy Freeze felt that no surgical intervention needed. Patient to follow with NS in 3-4 weeks for follow CCT. CT cervical spine showed extensive degenerative C4- T1, post surgical changes C5 and C6, old odontoid and C1 posterior arch fractures. Therapy evaluations done yesterday revealing unsteady gait, confusion and poor safety awareness affecting mobility as well as ability to carry out ADL task. CIR recommended for follow up therapy.   Used to go to the [employer] till months ago. Now goes to [facility] for exercise. Falls frequently but "keeps cane near by but trying to get way from it"Patient transferred to CIR on 07/01/2015 .    Patient currently requires mod with basic self-care skills secondary to muscle weakness, decreased cardiorespiratoy endurance, decreased problem solving, decreased safety awareness and decreased memory and decreased standing balance, decreased postural control and decreased balance strategies.  Prior to hospitalization, patient could complete ADLs and some IADLs with modified independent .  Patient will benefit from skilled intervention to decrease level of assist with basic self-care skills and increase independence with basic self-care skills prior to discharge home with care partner.  Anticipate patient will require 24 hour supervision and follow up home health.  OT - End of Session Activity Tolerance: Tolerates 10 - 20 min activity with multiple rests Endurance Deficit: Yes OT Assessment Rehab Potential (ACUTE ONLY): Good  OT Patient demonstrates impairments in the following area(s): Balance;Cognition;Endurance;Safety OT Basic ADL's Functional Problem(s): Bathing;Dressing;Toileting OT Advanced ADL's Functional Problem(s): Simple Meal Preparation OT Transfers Functional Problem(s): Toilet;Tub/Shower OT Additional Impairment(s): None OT Plan OT Intensity:  Minimum of 1-2 x/day, 45 to 90 minutes OT Frequency: 5 out of 7 days OT Duration/Estimated Length of Stay: 12-14 days OT Treatment/Interventions: Balance/vestibular training;Cognitive remediation/compensation;Community reintegration;Discharge planning;DME/adaptive equipment instruction;Functional mobility training;Pain management;Patient/family education;Self Care/advanced ADL retraining;UE/LE Strength taining/ROM;Therapeutic Exercise OT Self Feeding Anticipated Outcome(s): Mod I OT Basic Self-Care Anticipated Outcome(s): Supervision OT Toileting Anticipated Outcome(s): Supervision OT Bathroom Transfers Anticipated Outcome(s): Supervision OT Recommendation Patient destination: Home Follow Up Recommendations: Home health OT Equipment Recommended: To be determined   Skilled Therapeutic Intervention Pt seen for OT eval and bathing/dressing session. Pt in supine upon arrival, agreeable to tx session. He ambulated throughout room with +2 HHA. He required VCs for hand placement and safety awareness during functional transfers. Bathing completed with overall supervision/ VCs with steadying assist provided for standing pericare/ buttock hygiene. He dressed seated in chair at sink, requiring increased assist for dressing R LE due to R LE weakness which pt says is from previous fall "8-10 years ago".  Difficult to fully assess cognition due to pt being extremely HoH. Pt able to complete functional tasks in low stimulating environment with supervision and min-mod cuing for attention to task.  Pt left sitting up in chair at end of session, set-up with breakfast tray. QRB donned.   OT Evaluation Precautions/Restrictions  Precautions Precautions: Fall Precaution Comments: very HOH Restrictions Weight Bearing Restrictions: No General Chart Reviewed: Yes Pain Pain Assessment Pain Score: No/ denies pain Home Living/Prior Functioning Home Living Family/patient expects to be discharged to:: Private  residence Living Arrangements: Children Available Help at Discharge: Family, Available PRN/intermittently Type of Home: House Home Access: Stairs to enter Technical brewer of Steps: 6 Entrance Stairs-Rails: Can reach both Home Layout: Two level, Able to live on main level with bedroom/bathroom Bathroom Shower/Tub: Multimedia programmer: Standard Bathroom Accessibility: Yes Additional Comments: Pt very HOH, daughter clarified home layout (per chart)  Lives With: Family, Daughter IADL History Current License: Yes Mode of Transportation: Car Prior Function Level of Independence: Independent with gait, Independent with transfers, Independent with homemaking with ambulation, Requires assistive device for independence, Independent with basic ADLs  Able to Take Stairs?: Yes Driving: Yes Vocation: Retired Comments: using [facility] for community ambulation Vision/Perception  Vision- History Baseline Vision/History: Wears glasses Wears Glasses: At all times Patient Visual Report: No change from baseline Vision- Assessment Vision Assessment?: No apparent visual deficits  Cognition Overall Cognitive Status: History of cognitive impairments - at baseline Arousal/Alertness: Awake/alert Orientation Level: Person;Place;Situation Person: Oriented Place: Oriented Situation: Oriented Year: 2017 Month: April Day of Week: Correct (thurs) Immediate Memory Recall: Sock;Blue;Bed Memory Recall: Blue;Bed (2/3) Memory Recall Blue: Without Cue Memory Recall Bed: Without Cue Sensation Sensation Light Touch: Appears Intact Hot/Cold: Appears Intact Proprioception: Appears Intact Coordination Gross Motor Movements are Fluid and Coordinated: No Fine Motor Movements are Fluid and Coordinated: No Coordination and Movement Description: limited by R hip and ankle pain; pt with decreased coordination in B hands not formally assessed due to cognition Motor  Motor Motor: Within Functional  Limits  Trunk/Postural Assessment  Cervical Assessment Cervical Assessment: Within Functional Limits Thoracic Assessment Thoracic Assessment: Exceptions to [facility] (Flexed posture) Lumbar Assessment Lumbar Assessment: Exceptions to [facility] (Posterior pelvic tilt) Postural Control Postural Control: Within Functional Limits  Balance Balance Balance Assessed: Yes Dynamic Sitting Balance Dynamic Sitting -  Balance Support: Feet supported;During functional activity Dynamic Sitting - Level of Assistance: 5: Stand by assistance;4: Min assist Static Standing Balance Static Standing - Balance Support: During functional activity Static Standing - Level of Assistance: 4: Min assist;3: Mod assist Dynamic Standing Balance Dynamic Standing - Level of Assistance: 4: Min assist;3: Mod assist Dynamic Standing - Comments: Standing bathing task Extremity/Trunk Assessment RUE Assessment RUE Assessment: Within Functional Limits LUE Assessment LUE Assessment: Within Functional Limits   See Function Navigator for Current Functional Status.   Refer to Care Plan for Long Term Goals  Recommendations for other services: Neuropsych  Discharge Criteria: Patient will be discharged from OT if patient refuses treatment 3 consecutive times without medical reason, if treatment goals not met, if there is a change in medical status, if patient makes no progress towards goals or if patient is discharged from hospital.  The above assessment, treatment plan, treatment alternatives and goals were discussed and mutually agreed upon: by patient  Ernestina Patches 07/02/2015, 4:40 PM

## 2015-07-02 NOTE — Evaluation (Signed)
Physical Therapy Assessment and Plan  Patient Details  Name: Steven Duncan MRN: [id number] Date of Birth: 1928-04-10  PT Diagnosis: Abnormal posture, Abnormality of gait, Cognitive deficits, Difficulty walking, Muscle weakness and Pain in right hip and ankle Rehab Potential: Good ELOS: 12-14 days   Today's Date: 07/02/2015 PT Individual Time: 0900-1000 PT Individual Time Calculation (min): 60 min    Problem List:  Patient Active Problem List   Diagnosis Date Noted  . TBI (traumatic brain injury) ([place]) 07/01/2015  . 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 Easy fatigability   . Abnormality of gait   . Type 2 diabetes mellitus with complication, without long-term current use of insulin ([place])   . Cerebral salt-wasting   . HLD (hyperlipidemia)   . Acute subdural hematoma ([facility])   . Cognitive deficit as late effect of traumatic brain injury ([place])   . Benign essential HTN   . Type 2 diabetes mellitus without complication, without long-term current use of insulin ([place])   . HOH (hard of hearing)   . Hyponatremia   . Acute blood loss anemia   . SDH (subdural hematoma) ([place]) 06/26/2015    Past Medical History:  Past Medical History  Diagnosis Date  . Diabetes ([place])   . Gait disorder   . HTN (hypertension)   . Dyslipidemia   . HOH (hard of hearing)   . Anemia   . Subdural hematoma, acute ([place]) 06/2015  . Hyponatremia 06/2015   Past Surgical History:  Past Surgical History  Procedure Laterality Date  . Cervical spine surgery      C5 and C6 hemilaminectomy?  . Brain surgery  1951    "to relieve pressure from brain due to skull fractures"    Assessment & Plan Clinical Impression: Steven Duncan is a 82 y.o. right handed male with history of HTN, DMT2, depression, HOH, frequent falls, cognitive deficits who was admitted on 06/26/2015 after mechanical fall while in the shower with positive LOC (out about an hour per patient) and complaints of head and neck  pain. Family found patient confused and wandering around the house. CT head revealed small falcine and tentorial subdural hematoma and Dr. Cyndy Freeze felt that no surgical intervention needed. Patient to follow with NS in 3-4 weeks for follow CCT. CT cervical spine showed extensive degenerative C4- T1, post surgical changes C5 and C6, old odontoid and C1 posterior arch fractures. Therapy evaluations done yesterday revealing unsteady gait, confusion and poor safety awareness affecting mobility as well as ability to carry out ADL task.  Patient transferred to CIR on 07/01/2015 .   Patient currently requires mod with mobility secondary to muscle weakness and muscle joint tightness, decreased cardiorespiratoy endurance, unbalanced muscle activation, decreased awareness, decreased problem solving, decreased safety awareness and decreased memory and decreased standing balance and decreased balance strategies.  Prior to hospitalization, patient was modified independent  with mobility and lived with Family, Daughter in a House home.  Home access is 6Stairs to enter.  Patient will benefit from skilled PT intervention to maximize safe functional mobility, minimize fall risk and decrease caregiver burden for planned discharge home with 24 hour supervision.  Anticipate patient will benefit from follow up [place] at discharge.  PT - End of Session Activity Tolerance: Decreased this session;Tolerates 10 - 20 min activity with multiple rests Endurance Deficit: Yes PT Assessment Rehab Potential (ACUTE/IP ONLY): Good Barriers to Discharge: Decreased caregiver support PT Patient demonstrates impairments in the following area(s): Balance;Endurance;Motor;Nutrition;Pain;Safety PT Transfers  Functional Problem(s): Bed Mobility;Bed to Chair;Car;Furniture PT Locomotion Functional Problem(s): Ambulation;Wheelchair Mobility;Stairs PT Plan PT Intensity: Minimum of 1-2 x/day ,45 to 90 minutes PT Frequency: 5 out of 7 days PT Duration  Estimated Length of Stay: 12-14 days PT Treatment/Interventions: Ambulation/gait training;Balance/vestibular training;Cognitive remediation/compensation;Community reintegration;Discharge planning;Disease management/prevention;DME/adaptive equipment instruction;Functional mobility training;Neuromuscular re-education;Pain management;Patient/family education;Psychosocial support;Stair training;Therapeutic Activities;Therapeutic Exercise;UE/LE Strength taining/ROM;UE/LE Coordination activities;Wheelchair propulsion/positioning PT Transfers Anticipated Outcome(s): supervision PT Locomotion Anticipated Outcome(s): supervision PT Recommendation Follow Up Recommendations: 24 hour supervision/assistance;Home health PT Patient destination: Home  Skilled Therapeutic Intervention Treatment 1: Skilled therapeutic intervention initiated after completion of evaluation. Discussed with patient falls risk, safety within room, and focus of therapy during stay. Discussed possible length of stay, goals, and follow-up therapy. Patient currently min-mod A overall without device with mobility limited by R hip pain/old R ankle injury. Patient reports that he was falling once a day PTA. Difficult to assess cognition due to hearing impairment but patient tangential with functional conversation and memory deficits noted. Patient left sitting on toilet with NT in room to assist patient when finished toileting.  Treatment 2: Patient in bed upon arrival, donned shoes total A and patient transferred to wheelchair with mod A. Patient instructed in wheelchair propulsion using BUE with visual cues for technique with min A overall, performed simulated car transfer to sedan height without device, performed toilet transfer x 2 using grab bars with assist for clothing management and hygiene after small bowel movement with one episode of urinary incontinence, and performed NuStep using BUE/BLE at level 4 x 20 min. Gait training with trial of  SPC and RW, patient unsafe with SPC and recommending use of RW at this time. Patient required max multimodal cues for safe use of RW including hand placement with sit <> stand, backing up to seat before attempting to sit, and keeping body closer to RW. Patient ambulated multiple short distances in gym and from gym > room x 150 ft using RW with min-max A due to decreased safety but appeared less limited by R hip pain than ambulation without AD. Patient requested to return to bed, left semi reclined with needs in reach and bed alarm on.   PT Evaluation Precautions/Restrictions Precautions Precautions: Fall Precaution Comments: very HOH Restrictions Weight Bearing Restrictions: No General Chart Reviewed: Yes Family/Caregiver Present: No  Pain Pain Assessment Pain Assessment: 0-10 Pain Score: 6  Pain Type: Chronic pain Pain Location: Hip Pain Orientation: Right;Posterior Pain Descriptors / Indicators: Aching Pain Onset: With Activity Pain Intervention(s): Rest;Repositioned;Ambulation/increased activity Home Living/Prior Functioning Home Living Available Help at Discharge: Family;Available PRN/intermittently Type of Home: House Home Access: Stairs to enter [employer] of Steps: 6 Entrance Stairs-Rails: Can reach both Home Layout: Two level;Able to live on main level with bedroom/bathroom Bathroom Shower/Tub: Multimedia programmer: Standard Bathroom Accessibility: Yes  Lives With: Family;Daughter Prior Function Level of Independence: Independent with gait;Independent with transfers;Independent with homemaking with ambulation;Requires assistive device for independence;Independent with basic ADLs  Able to Take Stairs?: Yes Driving: Yes Vocation: Retired Comments: using [place] for community ambulation Vision/Perception   No change from baseline  Cognition Overall Cognitive Status: History of cognitive impairments - at baseline Arousal/Alertness:  Awake/alert Orientation Level: Oriented X4 Attention: Alternating Sustained Attention: Appears intact Selective Attention: Appears intact Alternating Attention: Impaired Alternating Attention Impairment: Verbal basic;Functional basic Memory: Impaired Memory Impairment: Retrieval deficit Awareness: Impaired Awareness Impairment: Emergent impairment Problem Solving: Impaired Problem Solving Impairment: Functional complex Safety/Judgment: Impaired Rancho Los Amigos Scales of Cognitive Functioning: Purposeful/appropriate Sensation Sensation Light Touch:  Appears Intact Hot/Cold: Appears Intact Proprioception: Appears Intact Coordination Gross Motor Movements are Fluid and Coordinated: No Fine Motor Movements are Fluid and Coordinated: No Coordination and Movement Description: limited by R hip and ankle pain Motor  Motor Motor: Abnormal postural alignment and control  Mobility Bed Mobility Bed Mobility: Supine to Sit;Sit to Supine Supine to Sit: 5: Supervision;HOB flat Sit to Supine: 5: Supervision;HOB flat Transfers Transfers: Yes Sit to Stand: 4: Min assist;With armrests Stand to Sit: 4: Min assist;With upper extremity assist Stand Pivot Transfers: 3: Mod assist;4: Min assist Locomotion  Ambulation Ambulation: Yes Ambulation/Gait Assistance: 4: Min assist Ambulation Distance (Feet): 25 Feet Assistive device: None;1 person hand held assist Gait Gait: Yes Gait Pattern: Impaired Gait Pattern: Step-through pattern;Decreased hip/knee flexion - right;Decreased trunk rotation;Trunk flexed Gait velocity: decreased Stairs / Additional Locomotion Stairs: Yes Stairs Assistance: 4: Min assist Stair Management Technique: Two rails;Step to pattern;Forwards Number of Stairs: 8 Height of Stairs: 3 Wheelchair Mobility Wheelchair Mobility: No  Trunk/Postural Assessment  Cervical Assessment Cervical Assessment: Exceptions to [facility] (decreased AROM, forward head) Thoracic  Assessment Thoracic Assessment: Exceptions to [facility] (forward flexed, decreased mobility) Lumbar Assessment Lumbar Assessment: Exceptions to WFL (decreased AROM, hx back surgery, posterior pelvic tilt) Postural Control Postural Control: Deficits on evaluation Protective Responses: delayed/insufficient  Balance Balance Balance Assessed: Yes Static Standing Balance Static Standing - Balance Support: Right upper extremity supported Static Standing - Level of Assistance: 5: Stand by assistance Dynamic Standing Balance Dynamic Standing - Level of Assistance: 4: Min assist;3: Mod assist Extremity Assessment  RLE Assessment RLE Assessment: Exceptions to [facility] RLE Strength RLE Overall Strength: Deficits;Due to premorbid status RLE Overall Strength Comments: grossly 4/5 throughout LLE Assessment LLE Assessment: Within Functional Limits   See Function Navigator for Current Functional Status.   Refer to Care Plan for Long Term Goals  Recommendations for other services: None  Discharge Criteria: Patient will be discharged from PT if patient refuses treatment 3 consecutive times without medical reason, if treatment goals not met, if there is a change in medical status, if patient makes no progress towards goals or if patient is discharged from hospital.  The above assessment, treatment plan, treatment alternatives and goals were discussed and mutually agreed upon: by patient  Laretta Alstrom 07/02/2015, 10:57 AM

## 2015-07-02 NOTE — Progress Notes (Signed)
[facility] PHYSICAL MEDICINE & REHABILITATION     PROGRESS NOTE    Subjective/Complaints: States he slept well. Minimal pain. No problems this morning.   ROS: limited due to mentation and hearing.    Objective: Vital Signs: Blood pressure 127/74, pulse 61, temperature 97.9 F (36.6 C), temperature source Oral, resp. rate 18, height  (1.676 m), weight 70.308 kg (155 lb), SpO2 100 %. No results found.  Recent Labs  07/02/15 0509  WBC 5.4  HGB 11.3*  HCT 32.9*  PLT 214    Recent Labs  07/01/15 0621 07/02/15 0509  NA 128* 126*  K 4.9 3.7  CL 88* 88*  GLUCOSE 156* 139*  BUN 13 12  CREATININE 0.80 0.80  CALCIUM 9.0 8.8*   CBG (last 3)   Recent Labs  07/01/15 1651 07/01/15 2122 07/02/15 0701  GLUCAP 117* 135* 134*    Wt Readings from Last 3 Encounters:  07/02/15 70.308 kg (155 lb)  06/26/15 70 kg (154 lb 5.2 oz)    Physical Exam:  Constitutional: He is oriented to person, place, and time. He appears well-developed and well-nourished. No distress.  HENT: oral mucosa pink/moist Head: Normocephalic and atraumatic.  Mouth/Throat: Oropharynx is clear and moist.  Eyes: Conjunctivae are normal. Pupils are equal, round, and reactive to light.  Neck: Normal range of motion. Neck supple.    old posterior neck incision. Ecchymosis behind ear/neck.  Cardiovascular: Normal rate and regular rhythm.  Respiratory: Effort normal and breath sounds normal. No stridor.  GI: Soft. Bowel sounds are normal. He exhibits no distension. There is no tenderness.  Musculoskeletal: He exhibits no edema or tenderness.  Tight heel cord right ankle (due to prior injury)  Neurological: He is alert and oriented to person, place, and time.  Extremely HOH despite hearing aids.  Alert and makes good eye contact with examiner.  Exam limited as patient is extremely hard of hearing with hearing aid in place.  Motor: B/l UE: 4+/5 proximal to distal B/l LE 4+/5 proximal to  distal Sensation intact to light touch DTRs 3+ left side  Skin: Skin is warm. No rash noted. He is not diaphoretic. No erythema.  Psychiatric: He has a normal mood and affect. Thought content normal.  Inappropriate laughter    Assessment/Plan: 1. Abnormality of gait/cognitive deficits/functional deficits secondary to traumatic SDH which require 3+ hours per day of interdisciplinary therapy in a comprehensive inpatient rehab setting. Physiatrist is providing close team supervision and 24 hour management of active medical problems listed below. Physiatrist and rehab team continue to assess barriers to discharge/monitor patient progress toward functional and medical goals.  Function:  Bathing Bathing position   Position: Shower  Bathing parts Body parts bathed by patient: Right arm, Left arm, Abdomen, Chest, Front perineal area, Right upper leg, Left upper leg Body parts bathed by helper: Buttocks, Right lower leg, Left lower leg, Back  Bathing assist        Upper Body Dressing/Undressing Upper body dressing   What is the patient wearing?: Pull over shirt/dress     Pull over shirt/dress - Perfomed by patient: Thread/unthread right sleeve, Thread/unthread left sleeve, Put head through opening, Pull shirt over trunk          Upper body assist        Lower Body Dressing/Undressing Lower body dressing   What is the patient wearing?: Pants, Socks, Shoes, Underwear Underwear - Performed by patient: Thread/unthread left underwear leg Underwear - Performed by helper: Thread/unthread right underwear leg,  Pull underwear up/down Pants- Performed by patient: Thread/unthread right pants leg, Thread/unthread left pants leg Pants- Performed by helper: Pull pants up/down     Socks - Performed by patient: Don/doff left sock Socks - Performed by helper: Don/doff right sock Shoes - Performed by patient: Don/doff right shoe, Don/doff left shoe, Fasten right, Fasten left             Lower body assist Assist for lower body dressing: Touching or steadying assistance (Pt > 75%)      Toileting Toileting   Toileting steps completed by patient: Performs perineal hygiene Toileting steps completed by helper: Adjust clothing prior to toileting, Adjust clothing after toileting    Toileting assist     Transfers Chair/bed Optician, dispensingtransfer             Locomotion Ambulation           Wheelchair          Cognition Comprehension Comprehension assist level: Understands basic 90% of the time/cues < 10% of the time  Expression Expression assist level: Expresses complex 90% of the time/cues < 10% of the time  Social Interaction Social Interaction assist level: Interacts appropriately 90% of the time - Needs monitoring or encouragement for participation or interaction.  Problem Solving Problem solving assist level: Solves complex 90% of the time/cues < 10% of the time  Memory Memory assist level: Recognizes or recalls 90% of the time/requires cueing < 10% of the time   Medical Problem List and Plan: 1. 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  -beginning therapies this morning  -he has severe hearing loss but does MUCH better when spoken to in lower volume and lower pitch  -vision is also limiting at times. Needs to wear glasses 2. DVT Prophylaxis/Anticoagulation: Mechanical: Sequential compression devices, below knee Bilateral lower extremities 3. Pain Management: Tylenol prn pain. Ice and heat for MS discomfort.  4. Mood: LCSW to follow for evaluation and support.  5. Neuropsych: This patient is not capable of making decisions on his own behalf. 6. Skin/Wound Care: Routine pressure relief measures.  7. Fluids/Electrolytes/Nutrition: Monitor I/O. See below.  8. DMT2: Monitor BS with ac/hs checks. Continue metformin bid 9. HTN: Monitor bid. Continue Prinivil and metoprolol--hold HCTZ. 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 Will place patient on salt tabs as well as 1200 cc fluid restriction. Held HCTZ and decreased Celexa to 5 mg daily. I personally reviewed the patient's labs today.  Na+ down to 126 today but expect it to trend upward.   -continue to check serially 11. Cognitive deficits: History of TBI as a teenager.  12. Dyslipidemia: On zocor.    LOS (Days) 1 A FACE TO FACE EVALUATION WAS PERFORMED  Gearldean Lomanto T 07/02/2015 9:06 AM

## 2015-07-03 ENCOUNTER — Inpatient Hospital Stay (HOSPITAL_COMMUNITY): Payer: Medicare Other | Admitting: Speech Pathology

## 2015-07-03 ENCOUNTER — Inpatient Hospital Stay (HOSPITAL_COMMUNITY): Payer: Medicare Other

## 2015-07-03 ENCOUNTER — Inpatient Hospital Stay (HOSPITAL_COMMUNITY): Payer: Medicare Other | Admitting: Occupational Therapy

## 2015-07-03 ENCOUNTER — Inpatient Hospital Stay (HOSPITAL_COMMUNITY): Payer: Medicare Other | Admitting: Physical Therapy

## 2015-07-03 DIAGNOSIS — S069X2S Unspecified intracranial injury with loss of consciousness of 31 minutes to 59 minutes, sequela: Secondary | ICD-10-CM

## 2015-07-03 LAB — GLUCOSE, CAPILLARY
Glucose-Capillary: 131 mg/dL — ABNORMAL HIGH (ref 65–99)
Glucose-Capillary: 90 mg/dL (ref 65–99)
Glucose-Capillary: 104 mg/dL — ABNORMAL HIGH (ref 65–99)
Glucose-Capillary: 128 mg/dL — ABNORMAL HIGH (ref 65–99)

## 2015-07-03 NOTE — Progress Notes (Signed)
Occupational Therapy Note  Patient Details  Name: Steven Duncan MRN: [id number] Date of Birth: 30-Dec-1928  Today's Date: 07/03/2015 OT Individual Time: 1300-1355 OT Individual Time Calculation (min): 55 min   Pt denied pain Individual therapy  Pt amb with RW to therapy gym and engaged in dynamic standing activities reaching for horse shoes on elevated surface and tossing at target.  Pt posterior lean when initially standing (weight shift to bilateral heels) but improved as activity continued.  Pt amb with RW to retrieve horse shoes from floor with reacher and return to container at opposite end of gym.  Pt amb with slow and deliberate gait with exaggerated heel strike. Pt returned to room and requested use of toilet before returning to sit EOB with alarm activated.  Focus on activity tolerance, functional amb with RW, dynamic standing balance, and safety awareness to increase independence with BADLs.   Lavone NeriLanier, Sila Sarsfield [facility]appell 07/03/2015, 2:25 PM

## 2015-07-03 NOTE — Progress Notes (Signed)
PHYSICAL MEDICINE & REHABILITATION     PROGRESS NOTE    Subjective/Complaints: Using bathroom with OT. No new complaints.    ROS: limited due to mentation and hearing.    Objective: Vital Signs: Blood pressure 143/73, pulse 62, temperature 98.3 F (36.8 C), temperature source Oral, resp. rate 17, height  (1.676 m), weight 68.629 kg (151 lb 4.8 oz), SpO2 98 %. No results found.  Recent Labs  07/02/15 0509  WBC 5.4  HGB 11.3*  HCT 32.9*  PLT 214    Recent Labs  07/01/15 0621 07/02/15 0509  NA 128* 126*  K 4.9 3.7  CL 88* 88*  GLUCOSE 156* 139*  BUN 13 12  CREATININE 0.80 0.80  CALCIUM 9.0 8.8*   CBG (last 3)   Recent Labs  07/02/15 1154 07/02/15 1658 07/02/15 2103  GLUCAP 120* 125* 98    Wt Readings from Last 3 Encounters:  07/03/15 68.629 kg (151 lb 4.8 oz)  06/26/15 70 kg (154 lb 5.2 oz)    Physical Exam:  Constitutional: He is oriented to person, place, and time. He appears well-developed and well-nourished. No distress.  HENT: oral mucosa pink/moist Head: Normocephalic and atraumatic.  Mouth/Throat: Oropharynx is clear and moist.  Eyes: Conjunctivae are normal. Pupils are equal, round, and reactive to light.  Neck: Normal range of motion. Neck supple.    old posterior neck incision. Ecchymosis behind ear/neck improving.  Cardiovascular: Normal rate and regular rhythm.  Respiratory: Effort normal and breath sounds normal. No stridor.  GI: Soft. Bowel sounds are normal. He exhibits no distension. There is no tenderness.  Musculoskeletal: He exhibits no edema or tenderness.  Tight heel cord right ankle   Neurological: He is alert and oriented to person, place when he is able to hear you. Fair insight Extremely HOH despite hearing aids.  Alert and makes good eye contact with examiner.  Exam remains limited as patient is extremely hard of hearing with hearing aid in place.  Motor: B/l UE: 4+/5 proximal to distal B/l LE 4+/5  proximal to distal Sensation intact to light touch DTRs 3+ left side  Skin: Skin is warm. No rash noted. He is not diaphoretic. No erythema.  Psychiatric: He has a normal mood and affect. Pleasant.    Assessment/Plan: 1. Abnormality of gait/cognitive deficits/functional deficits secondary to traumatic SDH which require 3+ hours per day of interdisciplinary therapy in a comprehensive inpatient rehab setting. Physiatrist is providing close team supervision and 24 hour management of active medical problems listed below. Physiatrist and rehab team continue to assess barriers to discharge/monitor patient progress toward functional and medical goals.  Function:  Bathing Bathing position   Position: Shower  Bathing parts Body parts bathed by patient: Right arm, Left arm, Abdomen, Chest, Front perineal area, Right upper leg, Left upper leg Body parts bathed by helper: Buttocks, Right lower leg, Left lower leg, Back  Bathing assist        Upper Body Dressing/Undressing Upper body dressing   What is the patient wearing?: Pull over shirt/dress     Pull over shirt/dress - Perfomed by patient: Thread/unthread right sleeve, Thread/unthread left sleeve, Put head through opening, Pull shirt over trunk          Upper body assist        Lower Body Dressing/Undressing Lower body dressing   What is the patient wearing?: Pants, Socks, Shoes, Underwear Underwear - Performed by patient: Thread/unthread left underwear leg Underwear - Performed by helper: Thread/unthread right  underwear leg, Pull underwear up/down Pants- Performed by patient: Thread/unthread right pants leg, Thread/unthread left pants leg Pants- Performed by helper: Pull pants up/down     Socks - Performed by patient: Don/doff left sock Socks - Performed by helper: Don/doff right sock Shoes - Performed by patient: Don/doff right shoe, Don/doff left shoe, Fasten right, Fasten left            Lower body assist Assist for  lower body dressing: Touching or steadying assistance (Pt > 75%)      Toileting Toileting   Toileting steps completed by patient: Performs perineal hygiene Toileting steps completed by helper: Adjust clothing prior to toileting, Adjust clothing after toileting, Performs perineal hygiene Toileting Assistive Devices: Grab bar or rail  Toileting assist Assist level: Touching or steadying assistance (Pt.75%)   Transfers Chair/bed transfer   Chair/bed transfer method: Stand pivot Chair/bed transfer assist level: Moderate assist (Pt 50 - 74%/lift or lower)       Locomotion Ambulation     Max distance: 25 ft Assist level: Touching or steadying assistance (Pt > 75%)   Wheelchair   Type: Manual Max wheelchair distance: 150 Assist Level: Touching or steadying assistance (Pt > 75%)  Cognition Comprehension Comprehension assist level: Set up assist with hearing device, Follows basic conversation/direction with extra time/assistive device  Expression Expression assist level: Expresses basic needs/ideas: With extra time/assistive device  Social Interaction Social Interaction assist level: Interacts appropriately with others with medication or extra time (anti-anxiety, antidepressant).  Problem Solving Problem solving assist level: Solves basic 90% of the time/requires cueing < 10% of the time  Memory Memory assist level: Recognizes or recalls 75 - 89% of the time/requires cueing 10 - 24% of the time   Medical Problem List and Plan: 1. 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  -continue CIR  -he has severe hearing loss but does MUCH better when spoken to in lower volume and lower pitch  -vision is also limiting at times. Needs to wear glasses 2. DVT Prophylaxis/Anticoagulation: Mechanical: Sequential compression devices, below knee Bilateral lower extremities 3. Pain Management: Tylenol prn pain. Ice and heat for MS discomfort.  4.  Mood: LCSW to follow for evaluation and support.  5. Neuropsych: This patient is not capable of making decisions on his own behalf. 6. Skin/Wound Care: Routine pressure relief measures.  7. Fluids/Electrolytes/Nutrition: Monitor I/O. See below.  8. DMT2: Monitor BS with ac/hs checks. Continue metformin bid 9. HTN: Monitor bid. Continue Prinivil and metoprolol--holding HCTZ. 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 Will place patient on salt tabs as well as 1200 cc fluid restriction. Held HCTZ and decreased Celexa to 5 mg daily.   Na+   126   but expect it to trend upward.   -recheck Saturday  11. Cognitive deficits: History of TBI as a teenager.  12. Dyslipidemia: On zocor.    LOS (Days) 2 A FACE TO FACE EVALUATION WAS PERFORMED  SWARTZ,ZACHARY T 07/03/2015 9:01 AM

## 2015-07-03 NOTE — IPOC Note (Signed)
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)   Easy fatigability   Abnormality of gait   Type 2 diabetes mellitus with complication, without long-term current use of insulin (HCC)   Cerebral salt-wasting   HLD (hyperlipidemia)     Functional Problem List: Nursing Bowel, Endurance, Medication Management, Motor, Safety, Skin Integrity, Pain, Perception  PT Balance, Endurance, Motor, Nutrition, Pain, Safety  OT Balance, Cognition, Endurance, Safety  SLP Cognition  TR         Basic ADL's: OT Bathing, Dressing, Toileting     Advanced  ADL's: OT Simple Meal Preparation     Transfers: PT Bed Mobility, Bed to Chair, Car, Occupational psychologisturniture  OT Toilet, Research scientist (life sciences)Tub/Shower     Locomotion: PT Ambulation, Psychologist, prison and probation servicesWheelchair Mobility, Stairs     Additional Impairments: OT None  SLP Social Cognition   Social Interaction, Attention, Awareness, Memory, Problem Solving  TR      Anticipated Outcomes Item Anticipated Outcome  Self Feeding Mod I  Swallowing      Basic self-care  Marketing executiveupervision  Toileting  Supervision   Bathroom Transfers Supervision  Bowel/Bladder  Mod I  Transfers  supervision  Locomotion  supervision  Communication     Cognition  Supervision   Pain  < 4  Safety/Judgment  Supervision   Therapy Plan: PT Intensity: Minimum of 1-2 x/day ,45 to 90 minutes PT Frequency: 5 out of 7 days PT Duration Estimated Length of Stay: 12-14 days OT Intensity: Minimum of 1-2 x/day, 45 to 90 minutes OT Frequency: 5 out of 7 days OT Duration/Estimated Length of Stay: 10-12 days SLP Intensity: Minumum of 1-2 x/day, 30 to 90 minutes SLP Frequency: 3 to 5 out of 7 days SLP Duration/Estimated Length of Stay: 12-14 days        Team  Interventions: Nursing Interventions Patient/Family Education, Bowel Management, Disease Management/Prevention, Cognitive Remediation/Compensation, Medication Management, Pain Management  PT interventions Ambulation/gait training, Balance/vestibular training, Cognitive remediation/compensation, Community reintegration, Discharge planning, Disease management/prevention, DME/adaptive equipment instruction, Functional mobility training, Neuromuscular re-education, Pain management, Patient/family education, Psychosocial support, Stair training, Therapeutic Activities, Therapeutic Exercise, UE/LE Strength taining/ROM, UE/LE Coordination activities, Wheelchair propulsion/positioning  OT Interventions Warden/rangerBalance/vestibular training, Cognitive remediation/compensation, FirefighterCommunity reintegration, Discharge planning, DME/adaptive equipment instruction, Functional mobility training, Pain management, Patient/family education, Self Care/advanced ADL retraining, UE/LE Strength taining/ROM, Therapeutic Exercise  SLP Interventions Cognitive remediation/compensation, Cueing hierarchy, Functional tasks, Environmental controls, Internal/external aids, Patient/family education, Therapeutic Activities  TR Interventions    SW/CM Interventions Discharge Planning, Restaurant manager, fast foodatient/Family Education, Psychosocial Support    Team Discharge Planning: Destination: PT-Home ,OT- Home , SLP-Home Projected Follow-up: PT-24 hour supervision/assistance, Home health PT, OT-  Home health OT, SLP-24 hour supervision/assistance, Home Health SLP, Outpatient SLP Projected Equipment Needs: PT- , OT- To be determined, SLP-None recommended by SLP Equipment Details: PT- , OT-  Patient/family involved in discharge planning: PT- Patient,  OT-Patient, SLP-Patient  MD ELOS: 12 days Medical Rehab Prognosis:  Excellent Assessment: The patient has been admitted for CIR therapies with the diagnosis of TBI. The team will be addressing functional mobility, strength,  stamina, balance, safety, adaptive techniques and equipment, self-care, bowel and bladder mgt, patient and caregiver education, NMR, vestibular eval/rx, cognition, behavior, adapt to hearing loss. Goals have been set at supervision for mobility, self-care/ADL's, and cognition.    Ranelle OysterZachary T. Mayzee Reichenbach,  MD, [facility]      See Team Conference Notes for weekly updates to the plan of care

## 2015-07-03 NOTE — Progress Notes (Signed)
Speech Language Pathology Daily Session Note  Patient Details  Name: Steven HaddockFrank Hartlage MRN: [id number] Date of Birth: 1928/07/24  Today's Date: 07/03/2015 SLP Individual Time: 1030-1100 SLP Individual Time Calculation (min): 30 min  Short Term Goals: Week 1: SLP Short Term Goal 1 (Week 1): Patient will demonstrate alternating attention between functional tasks for 10 minutes with supervision verbal cues for redirection.  SLP Short Term Goal 2 (Week 1): Patient will demonstrate functional problem solving for mildly complex tasks with supervision verbal cues.  SLP Short Term Goal 3 (Week 1): Patient will self-monitor and correct tangents during functional conversations with supervision verbal cues.  SLP Short Term Goal 4 (Week 1): Patient will demonstrate recall of daily information with supervision verbal and visual cues.   Skilled Therapeutic Interventions: Pt was hardworking in speech therapy. Positioned at EOB throughout session. Pt able to perform check writing/balancing with min verbal cueing for occasional request to check work. Pt was able to identify errors once cued to self-check and they appeared to be related to limited handwriting/decreased organization. Pt performed similiarly with money (coin) counting- 80% acc mod I. Min A to request self-monitoring was effective to identify and correct error.   Function:  Eating Eating                 Cognition Comprehension Comprehension assist level: Follows basic conversation/direction with extra time/assistive device  Expression   Expression assist level: Expresses basic 90% of the time/requires cueing < 10% of the time.  Social Interaction Social Interaction assist level: Interacts appropriately 90% of the time - Needs monitoring or encouragement for participation or interaction.  Problem Solving Problem solving assist level: Solves basic 75 - 89% of the time/requires cueing 10 - 24% of the time  Memory Memory assist level: Recognizes  or recalls 75 - 89% of the time/requires cueing 10 - 24% of the time    Pain Pain Assessment Pain Assessment: No/denies pain   Therapy/Group: Individual Therapy  Rocky CraftsKara E Mccall Lomax 07/03/2015, 1:03 PM

## 2015-07-03 NOTE — Progress Notes (Signed)
Occupational Therapy Session Note  Patient Details  Name: Steven HaddockFrank Duncan MRN: [id number] Date of Birth: 06/07/1928  Today's Date: 07/03/2015 OT Individual Time:  - [id number]  (60 min)      Short Term Goals: Week 1:  OT Short Term Goal 1 (Week 1): Pt will complete 2 grooming tasks in standing with min steadying assist OT Short Term Goal 2 (Week 1): Pt will ambulate to gather clothing items with min A and 2 VCs to maintain attention to task OT Short Term Goal 3 (Week 1): Pt will complete toilet transfer with min A and 1 VC for safety awareness      Skilled Therapeutic Interventions/Progress Updates:  Addressed  unsteady gait, confusion and poor safety awareness.  Pt transferred with min assist from wc to toilet.  Contanentof urine.   Pt. Ambulated with RW to shower .  Pt sat on 3n1 to shower with sit to stand using grab bar    Pt. Needed min directional cues for organizing and sustained attention to washing body patrs.  Stood to clean peri area with min assist.  Transferred to wc and sat at sink for shaving and grooming with set up assist.  Performed teeth in standing.  Donned clothes with min assist with buttoning and depends in sitting and standing.  Pt reading paper and discussing rain and temp appropriately.  .Left pt in wc with safety belt on and call bell,phone within reach.     Therapy Documentation Precautions:  Precautions Precautions: Fall Precaution Comments: very HOH Restrictions Weight Bearing Restrictions: No General:   Vital Signs: Therapy Vitals Temp: 98.3 F (36.8 C) Temp Source: Oral Oxygen Therapy SpO2: 98 % Pain:  none            See Function Navigator for Current Functional Status.   Therapy/Group: Individual Therapy  Steven Duncan, Steven Duncan 07/03/2015, 7:51 AM ``

## 2015-07-03 NOTE — Progress Notes (Signed)
Physical Therapy Session Note  Patient Details  Name: Steven HaddockFrank Biasi MRN: [id number] Date of Birth: Nov 05, 1928  Today's Date: 07/03/2015 PT Individual Time: 0915-1020 and [id number] PT Individual Time Calculation (min): 65 min and 60 min  Short Term Goals: Week 1:  PT Short Term Goal 1 (Week 1): Patient will perform transfers with consistent min A.  PT Short Term Goal 2 (Week 1): Patient will ambulate 150 ft with min A.  PT Short Term Goal 3 (Week 1): Patient will negotiate up/down 6" steps using 2 rails with min A. PT Short Term Goal 4 (Week 1): Patient will maintain standing balance during functional task x 3 min with min A.   Skilled Therapeutic Interventions/Progress Updates:   Treatment 1: Patient performed stand pivot transfers with min-mod A and decreased safety, requiring max cues for safe hand placement and turning completely before sitting. Patient demonstrates high fall risk as noted by score of 13/56 on Berg Balance Scale. Patient with heavy posterior LOB initially without any balance strategy noted requiring max A to prevent fall but with increased time and multiple attempts, patient able to utilize ankle strategy to maintain static standing balance with close supervision-min A. Patient provided multiple seated rest breaks to decrease frustration and improve success at balance tasks. Patient instructed in pipetree puzzle of moderate difficulty (Fig 8) and required increased time and min instructional cues for problem solving and sequencing from seated position. Patient demonstrated [facility] and [facility]UE coordination dropping pieces when manipulating smaller PVC pipes and connectors. Gait training using RW back to room x 150 ft with min A and greatly improved safety keeping RW closer to body compared to yesterday PM session. Patient requested to return to bed, left supine with needs in reach and bed alarm on.   Treatment 2: Patient standing with RW, handoff from OT. Patient  ambulated using RW throughout rehab unit from room > BI gym > main gym > room with min guard assist. To challenge standing balance and facilitate anterior weight shift due to posterior lean and weight shifted to heels in standing, performed Dynavision Mode A x 3 trials = 27 hits, 30 hits, and 30 sec with 1 seated rest break. Patient instructed in sit <> stand transfer training using RW with focus on hand placement and forward weight shift due to posterior lean and uncontrolled descent, supervision overall. Patient negotiated up/down 12 stairs using 2 rails with reciprocal pattern ascending and step-to pattern descending with supervision-min guard and noted R > L knee hyperextension in stance phase. Therex for LE strengthening: seated LAQ x 20 each LE with focus on RLE control, standing marching using RW x 20, and standing heel raises using RW x 20. Performed NuStep using BUE/BLE at level 5 x 8 min for strengthening and endurance. Patient ambulated back to room, doffed shoes with increased time, and left sitting edge of bed with bed alarm on and needs in reach to finish puzzles.   Therapy Documentation Precautions:  Precautions Precautions: Fall Precaution Comments: very HOH Restrictions Weight Bearing Restrictions: No Pain: Pain Assessment Pain Assessment: 0-10 Pain Score: 5  Pain Type: Acute pain Pain Location: Neck Pain Orientation: Left Pain Descriptors / Indicators: Aching Pain Onset: On-going Pain Intervention(s): Rest;Repositioned Balance: Standardized Balance Assessment Standardized Balance Assessment: Berg Balance Test Berg Balance Test Sit to Stand: Able to stand  independently using hands Standing Unsupported: Needs several tries to stand 30 seconds unsupported Sitting with Back Unsupported but Feet Supported on Floor or Stool: Able to  sit safely and securely 2 minutes Stand to Sit: Sits independently, has uncontrolled descent Transfers: Needs one person to assist Standing  Unsupported with Eyes Closed: Able to stand 10 seconds with supervision Standing Ubsupported with Feet Together: Needs help to attain position and unable to hold for 15 seconds From Standing, Reach Forward with Outstretched Arm: Loses balance while trying/requires external support From Standing Position, Pick up Object from Floor: Unable to try/needs assist to keep balance From Standing Position, Turn to Look Behind Over each Shoulder: Needs assist to keep from losing balance and falling Turn 360 Degrees: Needs assistance while turning Standing Unsupported, Alternately Place Feet on Step/Stool: Needs assistance to keep from falling or unable to try Standing Unsupported, One Foot in Front: Loses balance while stepping or standing Standing on One Leg: Unable to try or needs assist to prevent fall Total Score: 13/56  See Function Navigator for Current Functional Status.   Therapy/Group: Individual Therapy  Kerney Elbe 07/03/2015, 10:39 AM

## 2015-07-03 NOTE — Progress Notes (Signed)
Occupational Therapy Session Note  Patient Details  Name: Steven HaddockFrank Duncan MRN: [id number] Date of Birth: July 14, 1928  Today's Date: 07/03/2015 OT Individual Time: [id number] OT Individual Time Calculation (min): 24 min    Short Term Goals: Week 1:  OT Short Term Goal 1 (Week 1): Pt will complete 2 grooming tasks in standing with min steadying assist OT Short Term Goal 2 (Week 1): Pt will ambulate to gather clothing items with min A and 2 VCs to maintain attention to task OT Short Term Goal 3 (Week 1): Pt will complete toilet transfer with min A and 1 VC for safety awareness  Skilled Therapeutic Interventions/Progress Updates:    Pt seen for OT sesion focusing on cognitive re-training and attention to task. Pt sitting EOB upon arrival, agreeable to tx session. He participated in word jumble activity in newspaper with min cuing for attention to task as pt tangential when asked basic questions. Pt able to self correct mistakes when completing word puzzle. Pt left with hand off to PT.   Therapy Documentation Precautions:  Precautions Precautions: Fall Precaution Comments: very HOH Restrictions Weight Bearing Restrictions: No Pain: Pain Assessment Pain Assessment: No/denies pain  See Function Navigator for Current Functional Status.   Therapy/Group: Individual Therapy  Lewis, [name] 07/03/2015, 3:10 PM

## 2015-07-03 NOTE — Progress Notes (Signed)
Speech Language Pathology Daily Session Note  Patient Details  Name: Steven HaddockFrank Duncan MRN: [id number] Date of Birth: Aug 31, 1928  Today's Date: 07/03/2015 SLP Individual Time: 0730-0800 SLP Individual Time Calculation (min): 30 min  Short Term Goals: Week 1: SLP Short Term Goal 1 (Week 1): Patient will demonstrate alternating attention between functional tasks for 10 minutes with supervision verbal cues for redirection.  SLP Short Term Goal 2 (Week 1): Patient will demonstrate functional problem solving for mildly complex tasks with supervision verbal cues.  SLP Short Term Goal 3 (Week 1): Patient will self-monitor and correct tangents during functional conversations with supervision verbal cues.  SLP Short Term Goal 4 (Week 1): Patient will demonstrate recall of daily information with supervision verbal and visual cues.   Skilled Therapeutic Interventions: Skilled treatment session focused on cognitive goals. Upon arrival, patient sitting EOB consuming his breakfast. Patient consumed his meal and participated in a functional conversation with Mod I for ~15 minutes and was able to maintain the topic of conversation throughout the session with Mod I. Patient also utilized the menu to select his lunch and dinner meals with supervision visual cues. Patient requested to use the bathroom and required Min A verbal cues for safety. Patient left with NT on commode. Continue with current plan of care.    Function:  Eating Eating     Eating Assist Level: Set up assist for;Assistive Device Eating Assistive Device Comment: red built up gripper  Eating Set Up Assist For: Opening containers;Cutting food;Applying device (includes dentures)       Cognition Comprehension Comprehension assist level: Follows basic conversation/direction with extra time/assistive device  Expression   Expression assist level: Expresses basic 90% of the time/requires cueing < 10% of the time.  Social Interaction Social  Interaction assist level: Interacts appropriately 90% of the time - Needs monitoring or encouragement for participation or interaction.  Problem Solving Problem solving assist level: Solves basic 75 - 89% of the time/requires cueing 10 - 24% of the time  Memory Memory assist level: Recognizes or recalls 75 - 89% of the time/requires cueing 10 - 24% of the time    Pain Pain Assessment Pain Assessment: 0-10 Pain Score: 5  Pain Type: Acute pain Pain Location: Neck Pain Orientation: Left Pain Descriptors / Indicators: Aching Pain Onset: On-going Pain Intervention(s): Rest;Repositioned  Therapy/Group: Individual Therapy  Steven Duncan 07/03/2015, 11:08 AM

## 2015-07-04 ENCOUNTER — Inpatient Hospital Stay (HOSPITAL_COMMUNITY): Payer: Medicare Other | Admitting: Physical Therapy

## 2015-07-04 ENCOUNTER — Inpatient Hospital Stay (HOSPITAL_COMMUNITY): Payer: Medicare Other | Admitting: Occupational Therapy

## 2015-07-04 ENCOUNTER — Inpatient Hospital Stay (HOSPITAL_COMMUNITY): Payer: Medicare Other | Admitting: Speech Pathology

## 2015-07-04 DIAGNOSIS — I951 Orthostatic hypotension: Secondary | ICD-10-CM

## 2015-07-04 LAB — BASIC METABOLIC PANEL
Anion gap: 11 (ref 5–15)
BUN: 15 mg/dL (ref 6–20)
CALCIUM: 8.6 mg/dL — AB (ref 8.9–10.3)
CO2: 25 mmol/L (ref 22–32)
CREATININE: 0.92 mg/dL (ref 0.61–1.24)
Chloride: 93 mmol/L — ABNORMAL LOW (ref 101–111)
GFR calc Af Amer: >60 mL/min (ref >60)
GFR calc non Af Amer: >60 mL/min (ref >60)
GLUCOSE: 133 mg/dL — AB (ref 65–99)
Potassium: 3.7 mmol/L (ref 3.5–5.1)
Sodium: 129 mmol/L — ABNORMAL LOW (ref 135–145)

## 2015-07-04 LAB — GLUCOSE, CAPILLARY
Glucose-Capillary: 124 mg/dL — ABNORMAL HIGH (ref 65–99)
Glucose-Capillary: 57 mg/dL — ABNORMAL LOW (ref 65–99)
Glucose-Capillary: 78 mg/dL (ref 65–99)
Glucose-Capillary: 121 mg/dL — ABNORMAL HIGH (ref 65–99)
Glucose-Capillary: 128 mg/dL — ABNORMAL HIGH (ref 65–99)

## 2015-07-04 NOTE — Progress Notes (Signed)
Occupational Therapy Session Note  Patient Details  Name: Steven HaddockFrank Ferran MRN: [id number] Date of Birth: [birth year]  Today's Date: 07/04/2015 OT Individual Time:  -    0900-1045  (90 Min)  1st session                                            1430-1500  (30 min)  2nd session      Short Term Goals: Week 1:  OT Short Term Goal 1 (Week 1): Pt will complete 2 grooming tasks in standing with min steadying assist OT Short Term Goal 2 (Week 1): Pt will ambulate to gather clothing items with min A and 2 VCs to maintain attention to task OT Short Term Goal 3 (Week 1): Pt will complete toilet transfer with min A and 1 VC for safety awareness          1st session:    Pt. Sitting EOB upon OT arrival.   ADLtraining at sink level with walker and standing level. Sat EOB and donned both shoes with no assistance. Ambulated to sink with RW Pt stood with min assist for 17 minutes with no LOB. Pt completed 3 task at min assist level in standing.  Performed hair, teeth, and took pills in standing..  Pt amublated to wc  And then ambulated to AdL apt.   Engaged in kitchen task for balance and problem solving with min assit staying on tasks.  Pt. Retrieved items out of low cabinets and carried down counter with min assist for balance and problem solving    Had one LOB when backing from reftrigerator.  Ambulated in kitchen for 15 minutes with no sit break but sood standing rest breaks.   [facility] and engaged in checkers in standing for 15 minutes.  Ambulated back to room and left with all needs in reach.    2nd session:  Addressed functional mobility in room with straightening bed, making turns with walker,and ambulated to dayroom.  Performed abdominal strengthening on mat with some low back discomfort.  Ambulated back to room and left with all needs in reach.   Therapy Documentation Precautions:  Precautions Precautions: Fall Precaution Comments: very HOH Restrictions Weight Bearing Restrictions:  No    Vital Signs: Therapy Vitals Temp: 98.4 F (36.9 C) Temp Source: Oral Resp: 17 Oxygen Therapy SpO2: 98 % O2 Device: Not Delivered Pain:  Neck is sore and hip pain 4/10. Both sessions.   SEE MAR            See Function Navigator for Current Functional Status.   Therapy/Group: Individual Therapy  Humberto Sealsdwards, Vidya Bamford J 07/04/2015, 9:23 AM

## 2015-07-04 NOTE — Progress Notes (Signed)
Physical Therapy Session Note  Patient Details  Name: Steven Duncan MRN: [id number] Date of Birth: 09/26/1928  Today's Date: 07/04/2015 PT Individual Time: 1617-1700 PT Individual Time Calculation (min): 43 min   Short Term Goals: Week 1:  PT Short Term Goal 1 (Week 1): Patient will perform transfers with consistent min A.  PT Short Term Goal 2 (Week 1): Patient will ambulate 150 ft with min A.  PT Short Term Goal 3 (Week 1): Patient will negotiate up/down 6" steps using 2 rails with min A. PT Short Term Goal 4 (Week 1): Patient will maintain standing balance during functional task x 3 min with min A.  Week 2:     Skilled Therapeutic Interventions/Progress Updates:    Patient received supine in bed an agreeable to PT. Patient required mod A while donning shoes to maintain sitting balance in bed as well as min verbal instruction for problem solving technique with difficulty donning L shoe. Gait training with RW for 12950ft, min A from PT, min cues for foot clearance and occasional cues to keep RW close to body. Standing tolerance and problem solving with Pipe tree x 10 minutes with mod cues for error detection and correction. Gait to Ortho gym for 1125ft with RW. Car transfer training x 2 with Min A from PT and moderate verbal instruction for proper UE placement for safe transfer in and out of car. Gait training for 25300ft with RW to room with min A from PT, occasional verbal instruction for improved foot clearance and to maintain BUE on RW with forward progression. Patient left sitting EOB with call bell within reach and bed alarm set.  Throughout treatment, patient performed sit<> stand transfers with supervision A from PT with min cueing for UE placement to improve control of descent.   Therapy Documentation Precautions:  Precautions Precautions: Fall Precaution Comments: very HOH Restrictions Weight Bearing Restrictions: No General:   Vital Signs: Therapy Vitals Temp: 97.9 F (36.6  C) Temp Source: Oral Pulse Rate: (!) 59 Resp: 18 BP: (!) 136/51 mmHg Patient Position (if appropriate): Sitting Oxygen Therapy SpO2: 100 % O2 Device: Not Delivered   See Function Navigator for Current Functional Status.   Therapy/Group: Individual Therapy  Golden Popustin E Junia Nygren 07/04/2015, 6:02 PM a

## 2015-07-04 NOTE — Progress Notes (Signed)
Pt CBG was 57; asymptomatic. Pt given apple juice and CBG was rechecked to be 78. Pt sitting up in bed with call light within reach. Will continue to monitor. Dionne BucyP. Amo Gwendelyn Lanting RN

## 2015-07-04 NOTE — Progress Notes (Signed)
[facility] PHYSICAL MEDICINE & REHABILITATION     PROGRESS NOTE    Subjective/Complaints: Morning lying in bed. He is pleasant and notes that he is doing well.   ROS: Denies CP, SOB, N/V/D.    Objective: Vital Signs: Blood pressure 160/80, pulse 67, temperature 98.4 F (36.9 C), temperature source Oral, resp. rate 17, height 5\' 6"  (1.676 m), weight 69.5 kg (153 lb 3.5 oz), SpO2 98 %. No results found.  Recent Labs  07/02/15 0509  WBC 5.4  HGB 11.3*  HCT 32.9*  PLT 214    Recent Labs  07/02/15 0509 07/04/15 0701  NA 126* 129*  K 3.7 3.7  CL 88* 93*  GLUCOSE 139* 133*  BUN 12 15  CREATININE 0.80 0.92  CALCIUM 8.8* 8.6*   CBG (last 3)   Recent Labs  07/03/15 1630 07/03/15 2123 07/04/15 0656  GLUCAP 104* 128* 124*    Wt Readings from Last 3 Encounters:  07/04/15 69.5 kg (153 lb 3.5 oz)  06/26/15 70 kg (154 lb 5.2 oz)    Physical Exam:  Constitutional: He appears well-developed and well-nourished. No distress.  HENT: Normocephalic and atraumatic.  Eyes: Conjunctivae are normal.  Cardiovascular: Normal rate and regular rhythm.  Respiratory: Effort normal and breath sounds normal. No stridor.  GI: Soft. Bowel sounds are normal. He exhibits no distension. There is no tenderness.  Musculoskeletal: He exhibits no edema or tenderness.  Neurological: He is alert and oriented.  Extremely HOH despite hearing aids.  Exam remains limited as patient is extremely hard of hearing with hearing aid in place.  Motor: B/l UE: 4+/5 proximal to distal B/l LE 4+/5 proximal to distal Skin: Skin is warm. No rash noted. He is not diaphoretic. No erythema.  Psychiatric: He has a normal mood and affect. Pleasant.   Assessment/Plan: 1. Abnormality of gait/cognitive deficits/functional deficits secondary to traumatic SDH which require 3+ hours per day of interdisciplinary therapy in a comprehensive inpatient rehab setting. Physiatrist is providing close team supervision and  24 hour management of active medical problems listed below. Physiatrist and rehab team continue to assess barriers to discharge/monitor patient progress toward functional and medical goals.  Function:  Bathing Bathing position   Position: Shower  Bathing parts Body parts bathed by patient: Right arm, Left arm, Abdomen, Chest, Front perineal area, Right upper leg, Left upper leg Body parts bathed by helper: Buttocks, Right lower leg, Left lower leg, Back  Bathing assist Assist Level: Touching or steadying assistance(Pt > 75%)      Upper Body Dressing/Undressing Upper body dressing   What is the patient wearing?: Pull over shirt/dress     Pull over shirt/dress - Perfomed by patient: Thread/unthread right sleeve, Thread/unthread left sleeve, Put head through opening, Pull shirt over trunk          Upper body assist Assist Level: Supervision or verbal cues      Lower Body Dressing/Undressing Lower body dressing   What is the patient wearing?: Pants, Socks, Shoes, Underwear Underwear - Performed by patient: Thread/unthread left underwear leg, Thread/unthread right underwear leg Underwear - Performed by helper: Pull underwear up/down Pants- Performed by patient: Thread/unthread right pants leg, Thread/unthread left pants leg, Pull pants up/down Pants- Performed by helper: Fasten/unfasten pants     Socks - Performed by patient: Don/doff left sock, Don/doff right sock Socks - Performed by helper: Don/doff right sock Shoes - Performed by patient: Don/doff right shoe, Don/doff left shoe, Fasten right, Fasten left  Lower body assist Assist for lower body dressing: Touching or steadying assistance (Pt > 75%)      Toileting Toileting   Toileting steps completed by patient: Performs perineal hygiene Toileting steps completed by helper: Adjust clothing prior to toileting, Adjust clothing after toileting, Performs perineal hygiene Toileting Assistive Devices: Grab bar or  rail  Toileting assist Assist level: Touching or steadying assistance (Pt.75%)   Transfers Chair/bed transfer   Chair/bed transfer method: Stand pivot Chair/bed transfer assist level: Moderate assist (Pt 50 - 74%/lift or lower)       Locomotion Ambulation     Max distance: 150 ft Assist level: Touching or steadying assistance (Pt > 75%)   Wheelchair   Type: Manual Max wheelchair distance: 150 Assist Level: Dependent (Pt equals 0%)  Cognition Comprehension Comprehension assist level: Follows basic conversation/direction with no assist  Expression Expression assist level: Expresses basic needs/ideas: With no assist  Social Interaction Social Interaction assist level: Interacts appropriately 90% of the time - Needs monitoring or encouragement for participation or interaction.  Problem Solving Problem solving assist level: Solves basic 90% of the time/requires cueing < 10% of the time  Memory Memory assist level: Recognizes or recalls 90% of the time/requires cueing < 10% of the time   Medical Problem List and Plan: 1. 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  -continue CIR  -he has severe hearing loss but does MUCH better when spoken to in lower volume and lower pitch  -vision is also limiting at times. Needs to wear glasses 2. DVT Prophylaxis/Anticoagulation: Mechanical: Sequential compression devices, below knee Bilateral lower extremities 3. Pain Management: Tylenol prn pain. Ice and heat for MSK discomfort.  4. Mood: LCSW to follow for evaluation and support.  5. Neuropsych: This patient is not capable of making decisions on his own behalf. 6. Skin/Wound Care: Routine pressure relief measures.  7. Fluids/Electrolytes/Nutrition: Monitor I/O. See below.  8. DMT2: Monitor BS with ac/hs checks. Continue metformin bid 9. HTN: Monitor bid. Continue Prinivil, and metoprolol--holding HCTZ. Will not increase BP meds at  this time due to orthostasis. 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 Will place patient on salt tabs as well as 1200 cc fluid restriction. Held HCTZ and decreased Celexa to 5 mg daily.   Na+   129 on 4/8 (improving).  11. Cognitive deficits: History of TBI as a teenager.  12. Dyslipidemia: On zocor.    LOS (Days) 3 A FACE TO FACE EVALUATION WAS PERFORMED  Steven Duncan Karis Juba 07/04/2015 9:37 AM

## 2015-07-04 NOTE — Plan of Care (Signed)
Problem: RH BOWEL ELIMINATION Goal: RH STG MANAGE BOWEL W/MEDICATION W/ASSISTANCE STG Manage Bowel with Medication with Mod I Assistance.  Outcome: Not Progressing Patient has not have bowel since 07/01/15

## 2015-07-04 NOTE — Progress Notes (Signed)
Speech Language Pathology Daily Session Note  Patient Details  Name: Steven Duncan MRN: [id number] Date of Birth: 11/29/1928  Today's Date: 07/04/2015 SLP Individual Time: 0800-0830 SLP Individual Time Calculation (min): 30 min  Short Term Goals: Week 1: SLP Short Term Goal 1 (Week 1): Patient will demonstrate alternating attention between functional tasks for 10 minutes with supervision verbal cues for redirection.  SLP Short Term Goal 2 (Week 1): Patient will demonstrate functional problem solving for mildly complex tasks with supervision verbal cues.  SLP Short Term Goal 3 (Week 1): Patient will self-monitor and correct tangents during functional conversations with supervision verbal cues.  SLP Short Term Goal 4 (Week 1): Patient will demonstrate recall of daily information with supervision verbal and visual cues.   Skilled Therapeutic Interventions:  Pt was seen for skilled ST targeting cognitive goals.  Pt counted money and made change from money managemetn subtest of the ALFA standardized cognitive assessment for 100% accuracy with mod I.  Pt was 80% accurate for completing functional math calculations from word problems, which improved to 90% accuracy with min assist verbal cues for thought organization and error awareness.  Pt left sitting at edge of bed with call bell within reach and bed alarm set.  Continue per current plan of care.    Function:  Eating Eating                 Cognition Comprehension Comprehension assist level: Follows basic conversation/direction with no assist  Expression   Expression assist level: Expresses basic needs/ideas: With no assist  Social Interaction Social Interaction assist level: Interacts appropriately 90% of the time - Needs monitoring or encouragement for participation or interaction.  Problem Solving Problem solving assist level: Solves basic 90% of the time/requires cueing < 10% of the time  Memory Memory assist level: Recognizes or  recalls 90% of the time/requires cueing < 10% of the time    Pain Pain Assessment Pain Assessment: No/denies pain   Therapy/Group: Individual Therapy  [place] Basham, Melanee SpryNicole L 07/04/2015, 12:18 PM

## 2015-07-05 LAB — GLUCOSE, CAPILLARY
GLUCOSE-CAPILLARY: 146 mg/dL — AB (ref 65–99)
Glucose-Capillary: 88 mg/dL (ref 65–99)
Glucose-Capillary: 116 mg/dL — ABNORMAL HIGH (ref 65–99)
Glucose-Capillary: 158 mg/dL — ABNORMAL HIGH (ref 65–99)

## 2015-07-05 NOTE — Progress Notes (Signed)
[name]IC[facility]ILITATION     PROGRESS NOTE    Subjective/Complaints: Pt seen laying in bed.  He is tired and wants to sleep during his relatively light day of therapies.   ROS: Denies CP, SOB, N/V/D.  Objective: Vital Signs: Blood pressure 156/88, pulse 66, temperature 98.3 F (36.8 C), temperature source Oral, resp. rate 17, height  (1.676 m), weight 71 kg (156 lb 8.4 oz), SpO2 92 %. No results found. No results for input(s): WBC, HGB, HCT, PLT in the last 72 hours.  Recent Labs  07/04/15 0701  NA 129*  K 3.7  CL 93*  GLUCOSE 133*  BUN 15  CREATININE 0.92  CALCIUM 8.6*   CBG (last 3)   Recent Labs  07/04/15 2037 07/05/15 0633 07/05/15 1143  GLUCAP 128* 146* 88    Wt Readings from Last 3 Encounters:  07/05/15 71 kg (156 lb 8.4 oz)  06/26/15 70 kg (154 lb 5.2 oz)    Physical Exam:  Constitutional: He appears well-developed and well-nourished. No distress.  HENT: Normocephalic and atraumatic.  Eyes: Conjunctivae are normal.  Cardiovascular: Normal rate and regular rhythm.  Respiratory: Effort normal and breath sounds normal. No stridor.  GI: Soft. Bowel sounds are normal. He exhibits no distension. There is no tenderness.  Musculoskeletal: He exhibits no edema or tenderness.  Neurological: He is alert and oriented.  Extremely HOH despite hearing aids.  Exam remains limited as patient is extremely hard of hearing with hearing aid in place.  Motor: B/l UE: 4+/5 proximal to distal B/l LE 4+/5 proximal to distal Skin: Skin is warm. No rash noted. He is not diaphoretic. No erythema.  Psychiatric: He has a normal mood and affect. Pleasant.   Assessment/Plan: 1. Abnormality of gait/cognitive deficits/functional deficits secondary to traumatic SDH which require 3+ hours per day of interdisciplinary therapy in a comprehensive inpatient rehab setting. Physiatrist is providing close team supervision and 24 hour management of active medical  problems listed below. Physiatrist and rehab team continue to assess barriers to discharge/monitor patient progress toward functional and medical goals.  Function:  Bathing Bathing position   Position: Shower  Bathing parts Body parts bathed by patient: Right arm, Left arm, Abdomen, Chest, Front perineal area, Right upper leg, Left upper leg Body parts bathed by helper: Buttocks, Right lower leg, Left lower leg, Back  Bathing assist Assist Level: Touching or steadying assistance(Pt > 75%)      Upper Body Dressing/Undressing Upper body dressing   What is the patient wearing?: Pull over shirt/dress     Pull over shirt/dress - Perfomed by patient: Thread/unthread right sleeve, Thread/unthread left sleeve, Put head through opening, Pull shirt over trunk          Upper body assist Assist Level: Supervision or verbal cues      Lower Body Dressing/Undressing Lower body dressing   What is the patient wearing?: Pants, Socks, Shoes, Underwear Underwear - Performed by patient: Thread/unthread left underwear leg, Thread/unthread right underwear leg Underwear - Performed by helper: Pull underwear up/down Pants- Performed by patient: Thread/unthread right pants leg, Thread/unthread left pants leg, Pull pants up/down Pants- Performed by helper: Fasten/unfasten pants     Socks - Performed by patient: Don/doff left sock, Don/doff right sock Socks - Performed by helper: Don/doff right sock Shoes - Performed by patient: Don/doff right shoe, Don/doff left shoe, Fasten right, Fasten left            Lower body assist Assist for lower body  dressing: Touching or steadying assistance (Pt > 75%)      Toileting Toileting   Toileting steps completed by patient: Performs perineal hygiene Toileting steps completed by helper: Adjust clothing prior to toileting, Adjust clothing after toileting, Performs perineal hygiene Toileting Assistive Devices: Grab bar or rail  Toileting assist Assist level:  Touching or steadying assistance (Pt.75%)   Transfers Chair/bed transfer   Chair/bed transfer method: Ambulatory Chair/bed transfer assist level: Touching or steadying assistance (Pt > 75%) Chair/bed transfer assistive device: Patent attorneyWalker     Locomotion Ambulation     Max distance: 200 Assist level: Touching or steadying assistance (Pt > 75%)   Wheelchair   Type: Manual Max wheelchair distance: 150 Assist Level: Dependent (Pt equals 0%)  Cognition Comprehension Comprehension assist level: Follows basic conversation/direction with extra time/assistive device  Expression Expression assist level: Expresses basic needs/ideas: With no assist  Social Interaction Social Interaction assist level: Interacts appropriately 90% of the time - Needs monitoring or encouragement for participation or interaction.  Problem Solving Problem solving assist level: Solves basic problems with no assist  Memory Memory assist level: Recognizes or recalls 90% of the time/requires cueing < 10% of the time   Medical Problem List and Plan: 1. 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  -continue CIR  -he has severe hearing loss but does better when spoken to in lower volume and lower pitch  -vision is also limiting at times. Needs to wear glasses 2. DVT Prophylaxis/Anticoagulation: Mechanical: Sequential compression devices, below knee Bilateral lower extremities 3. Pain Management: Tylenol prn pain. Ice and heat for MSK discomfort.  4. Mood: LCSW to follow for evaluation and support.  5. Neuropsych: This patient is not capable of making decisions on his own behalf. 6. Skin/Wound Care: Routine pressure relief measures.  7. Fluids/Electrolytes/Nutrition: Monitor I/O.  Eating 100% of meals 8. DMT2: Monitor BS with ac/hs checks. Continue metformin bid 9. HTN: Monitor bid. Continue Prinivil, and metoprolol--holding HCTZ. Will not increase BP meds at this  time due to orthostasis. 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 Will place patient on salt tabs as well as 1200 cc fluid restriction. Held HCTZ and decreased Celexa to 5 mg daily.     Na+   129 on 4/8 (improving).  11. Cognitive deficits: History of TBI as a teenager.  12. Dyslipidemia: On zocor.    LOS (Days) 4 A FACE TO FACE EVALUATION WAS PERFORMED  Steven Duncan Steven Duncan 07/05/2015 2:02 PM

## 2015-07-05 NOTE — Progress Notes (Signed)
Social Work  Social Work Assessment and Plan  Patient Details  Name: Kathrine HaddockFrank Aber MRN: [id number] Date of Birth: October 05, 1928  Today's Date: 07/03/2015  Problem List:  Patient Active Problem List   Diagnosis Date Noted  . Orthostatic hypotension   . TBI (traumatic brain injury) (HCC) 07/01/2015  . 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 Easy fatigability   . Abnormality of gait   . Type 2 diabetes mellitus with complication, without long-term current use of insulin (HCC)   . Cerebral salt-wasting   . HLD (hyperlipidemia)   . Acute subdural hematoma (HCC)   . Cognitive deficit as late effect of traumatic brain injury (HCC)   . Benign essential HTN   . Type 2 diabetes mellitus without complication, without long-term current use of insulin (HCC)   . HOH (hard of hearing)   . Hyponatremia   . Acute blood loss anemia   . SDH (subdural hematoma) (HCC) 06/26/2015   Past Medical History:  Past Medical History  Diagnosis Date  . Diabetes (HCC)   . Gait disorder   . HTN (hypertension)   . Dyslipidemia   . HOH (hard of hearing)   . Anemia   . Subdural hematoma, acute (HCC) 06/2015  . Hyponatremia 06/2015   Past Surgical History:  Past Surgical History  Procedure Laterality Date  . Cervical spine surgery      C5 and C6 hemilaminectomy?  . Brain surgery  1951    "to relieve pressure from brain due to skull fractures"   Social History:  reports that he has quit smoking. He has never used smokeless tobacco. He reports that he does not drink alcohol or use illicit drugs.  Family / Support Systems Marital Status: Widow/Widower How Long?: 2014 Patient Roles: Parent (grandparent) Children: daughter, Lambert ModyMarcia Nichols @ [phone](C) [phone];  daughter, Olegario MessierKathy [facility]([place]) and son, [name] ([place]) Other Supports: 80 yo grandson and his grandaughter also living in the home and can provide some support. Anticipated Caregiver: daughter, Leta JunglingMarcia - pt has lived  with her since Aug 2016 Ability/Limitations of Caregiver: daughter works as a Management consultantschool teacher Caregiver Availability: Evenings only Family Dynamics: Pt reports that his daughter and her family are very supportive.  Social History Preferred language: English Religion: Christian Cultural Background: NA Read: Yes Write: Yes Employment Status: Retired Date Retired/Disabled/Unemployed: age 82 Legal Hisotry/Current Legal Issues: None Guardian/Conservator: None - per MD, pt is not yet capable of making decisions on his own behalf.  Defer to daughter.   Abuse/Neglect Physical Abuse: Denies Verbal Abuse: Denies Sexual Abuse: Denies Exploitation of patient/patient's resources: Denies Self-Neglect: Denies  Emotional Status Pt's affect, behavior adn adjustment status: Pt very pleasant but VERY HOH.  We are able to conduct the assessment interview, however, his hearing significantly limited what info I was able to gather.  Need to confirm all info with daughter.  Pt with very bright affect and laughing with staff and making jokes.  He denies any emotional distress.  No s/s of depression noted, however, will monitor. Recent Psychosocial Issues: Permanently moved in with daughter and her family in 2016 after several years of splitting his time between his home with [place] and with daughter. Pyschiatric History: None Substance Abuse History: None  Patient / Family Perceptions, Expectations & Goals Pt/Family understanding of illness & functional limitations: Pt with very basic understanding that he suffered some bleeding into his brain from a fall at home.  Basic awareness of his funtional limitations. Premorbid pt/family  roles/activities: Pt independent overall and had intermittent support at home with daughter. Anticipated changes in roles/activities/participation: Pt may need to have 24/7 supervision initially - this could require a change in the support family is providing. Pt/family  expectations/goals: "I just want to get back home."  [employer]: None Premorbid Home Care/DME Agencies: None Transportation available at discharge: yes Resource referrals recommended: Neuropsychology  Discharge Planning Living Arrangements: Children Support Systems: Children Type of Residence: Private residence Insurance Resources: [employer], Media planner (specify) Print production planner) Architect: Restaurant manager, fast food Screen Referred: No Living Expenses: Lives with family Money Management: Family Does the patient have any problems obtaining your medications?: No Home Management: mostly family Patient/Family Preliminary Plans: Pt to return home with daughter and her family  Social Work Anticipated Follow Up Needs: HH/OP Expected length of stay: 12-14 days  Clinical Impression Very pleasant but VERY HOH, elderly gentleman here following a fall at home and suffering a SDH.  Living with daughter and her family who provide evening/ intermittent support.  Pt was independent PTA but may now need closer supervision - unsure if family can provide this/ TBD.  Anticipating good physical gains and supervision goals. Will follow for support and d/c planning needs.  Sammie Schermerhorn 07/03/2015, 12:36 PM

## 2015-07-06 ENCOUNTER — Inpatient Hospital Stay (HOSPITAL_COMMUNITY): Payer: Medicare Other | Admitting: Physical Therapy

## 2015-07-06 ENCOUNTER — Inpatient Hospital Stay (HOSPITAL_COMMUNITY): Payer: Medicare Other

## 2015-07-06 ENCOUNTER — Inpatient Hospital Stay (HOSPITAL_COMMUNITY): Payer: Medicare Other | Admitting: Speech Pathology

## 2015-07-06 ENCOUNTER — Inpatient Hospital Stay (HOSPITAL_COMMUNITY): Payer: Medicare Other | Admitting: Occupational Therapy

## 2015-07-06 LAB — GLUCOSE, CAPILLARY
GLUCOSE-CAPILLARY: 113 mg/dL — AB (ref 65–99)
GLUCOSE-CAPILLARY: 53 mg/dL — AB (ref 65–99)
GLUCOSE-CAPILLARY: 148 mg/dL — AB (ref 65–99)
Glucose-Capillary: 132 mg/dL — ABNORMAL HIGH (ref 65–99)
Glucose-Capillary: 125 mg/dL — ABNORMAL HIGH (ref 65–99)

## 2015-07-06 MED ORDER — INSULIN ASPART 100 UNIT/ML ~~LOC~~ SOLN: 0.0000 [IU] | Freq: Every day | SUBCUTANEOUS | Status: DC

## 2015-07-06 MED ORDER — INSULIN ASPART 100 UNIT/ML ~~LOC~~ SOLN
0.0000 [IU] | Freq: Three times a day (TID) | SUBCUTANEOUS | Status: DC
  Administered 2015-07-07 (×2): 2 [IU] via SUBCUTANEOUS
  Administered 2015-07-08 – 2015-07-09 (×2): 4 [IU] via SUBCUTANEOUS

## 2015-07-06 NOTE — Progress Notes (Signed)
Hypoglycemic Event  CBG:53  Treatment: 15 GM carbohydrate snack  Symptoms: None  Follow-up CBG: Time:1227 CBG Result:113  Possible Reasons for Event: Unknown  Comments/MD notified:Pamela Love notified of hypoglycemic event. Pt ate all of his lunch and follow up CBG was 113.     Maansi Wike S

## 2015-07-06 NOTE — Progress Notes (Signed)
Occupational Therapy Session Note  Patient Details  Name: Steven HaddockFrank Duncan MRN: [id number] Date of Birth: March 08, 1929  Today's Date: 07/06/2015 OT Individual Time: [id number] OT Individual Time Calculation (min): 73 min    Short Term Goals: Week 1:  OT Short Term Goal 1 (Week 1): Pt will complete 2 grooming tasks in standing with min steadying assist OT Short Term Goal 2 (Week 1): Pt will ambulate to gather clothing items with min A and 2 VCs to maintain attention to task OT Short Term Goal 3 (Week 1): Pt will complete toilet transfer with min A and 1 VC for safety awareness  Skilled Therapeutic Interventions/Progress Updates:    Pt sitting in recliner upon arrival.  Pt stated he had already showered and dressed and was ready for therapy.  Pt amb with RW to therapy gym and engaged in dynamic standing tasks while standing on foam cushion to place and retrieve items outside BOS.  Pt also retrieved items from floor using a reacher.  Pt amb with RW in gym to gather items and carry them (with appropriate compensatory strategies) to another location.  Pt requires steady A when standing for tasks and when ambulating.  Pt exhibited LOB X 3 when standing on foam cushion/mat requiring assistance to correct.  Pt required min verbal cues for appropriate BUE placement when stand>sit.   Therapy Documentation Precautions:  Precautions Precautions: Fall Precaution Comments: very HOH Restrictions Weight Bearing Restrictions: No  Pain: Pain Assessment Pain Assessment: No/denies pain  See Function Navigator for Current Functional Status.   Therapy/Group: Individual Therapy  Rich BraveLanier, Najla Aughenbaugh Chappell 07/06/2015, 11:30 AM

## 2015-07-06 NOTE — Progress Notes (Signed)
Speech Language Pathology Daily Session Note  Patient Details  Name: Steven Duncan MRN: [id number] Date of Birth: Mar 24, 1929  Today's Date: 07/06/2015 SLP Individual Time: 1330-1415 SLP Individual Time Calculation (min): 45 min  Short Term Goals: Week 1: SLP Short Term Goal 1 (Week 1): Patient will demonstrate alternating attention between functional tasks for 10 minutes with supervision verbal cues for redirection.  SLP Short Term Goal 2 (Week 1): Patient will demonstrate functional problem solving for mildly complex tasks with supervision verbal cues.  SLP Short Term Goal 3 (Week 1): Patient will self-monitor and correct tangents during functional conversations with supervision verbal cues.  SLP Short Term Goal 4 (Week 1): Patient will demonstrate recall of daily information with supervision verbal and visual cues.   Skilled Therapeutic Interventions: Skilled treatment session focused on cognitive goals. Upon arrival, patient requested to use the bathroom. Patient ambulated to the bathroom with the RW and required Mod A verbal cues for safety with task. SLP also facilitated session by providing Mod A question and verbal cues for recall of his current medications their function as well as mental flexibility throughout the task. Patient also required Mod A question cues for recall of events from his previous therapy sessions. Patient left upright in recliner with quick release belt in place and all needs within reach. Continue with current plan of care.    Function:  Cognition Comprehension Comprehension assist level: Understands basic 90% of the time/cues < 10% of the time  Expression   Expression assist level: Expresses basic 90% of the time/requires cueing < 10% of the time.  Social Interaction Social Interaction assist level: Interacts appropriately 90% of the time - Needs monitoring or encouragement for participation or interaction.  Problem Solving Problem solving assist level: Solves  basic 50 - 74% of the time/requires cueing 25 - 49% of the time  Memory Memory assist level: Recognizes or recalls 50 - 74% of the time/requires cueing 25 - 49% of the time    Pain No/Denies Pain   Therapy/Group: Individual Therapy  Steven Duncan 07/06/2015, 4:05 PM

## 2015-07-06 NOTE — Progress Notes (Signed)
Physical Therapy Session Note  Patient Details  Name: Steven HaddockFrank Duncan MRN: [id number] Date of Birth: [birth year]  Today's Date: 07/06/2015 PT Individual Time: 0906-1004 PT Individual Time Calculation (min): 58 min   Short Term Goals: Week 1:  PT Short Term Goal 1 (Week 1): Patient will perform transfers with consistent min A.  PT Short Term Goal 2 (Week 1): Patient will ambulate 150 ft with min A.  PT Short Term Goal 3 (Week 1): Patient will negotiate up/down 6" steps using 2 rails with min A. PT Short Term Goal 4 (Week 1): Patient will maintain standing balance during functional task x 3 min with min A.   Skilled Therapeutic Interventions/Progress Updates:    Pt received resting in bed, no c/o pain, agitated that he had not been able to get up to shower yet.  Session focus on dynamic sitting/standing balance for bathing at shower level and dressing/grooming at sink, gait training with RW, and midline orientation in standing.  Supine>sit mod I and squat/pivot to w/c with supervision.  Sit>stand and step into shower onto shower chair with steady assist for balance and verbal cues for use of grab bar to provide support.  Pt able to bathe at shower level with supervision except with sit<>stand to wash buttocks requiring min assist for balance.  Pt returned to w/c after shower and positioned in front of sink for dressing and grooming tasks.  Dynamic sitting balance for dressing with supervision for LB and verbal cues to lock w/c, set up for UB.  Sit>stand at sink for oral care and grooming tasks with pt steadying self on sink with LUE.  Gait training to therapy gym with RW and close supervision with steady assist during turns due to LOB.  Pt engaged in standing activity on foam wedge to encourage forward weight shift while matching 18 cards, supervision>steady assist for balance.  Pt amb back to room with close supervision and steady assist during turns, requesting to toilet.  Handoff to NT in bathroom.     Therapy Documentation Precautions:  Precautions Precautions: Fall Precaution Comments: very HOH Restrictions Weight Bearing Restrictions: No  See Function Navigator for Current Functional Status.   Therapy/Group: Individual Therapy  [name] 07/06/2015, 12:01 PM

## 2015-07-06 NOTE — Progress Notes (Signed)
Occupational Therapy Session Note  Patient Details  Name: Steven Duncan MRN: [id number] Date of Birth: 1928/11/07  Today's Date: 07/06/2015 OT Individual Time: 1430-1600 OT Individual Time Calculation (min): 90 min    Short Term Goals: Week 1:  OT Short Term Goal 1 (Week 1): Pt will complete 2 grooming tasks in standing with min steadying assist OT Short Term Goal 2 (Week 1): Pt will ambulate to gather clothing items with min A and 2 VCs to maintain attention to task OT Short Term Goal 3 (Week 1): Pt will complete toilet transfer with min A and 1 VC for safety awareness      Skilled Therapeutic Interventions/Progress Updates:    Pt seen for skilled OT to facilitate dynamic balance for fall prevention, cognitive activities to increase processing speed and problem solving. Pt received in room.  Ambulated to therapy room to work on sorting/organizing and counting of money.  Pt had difficulty separating bills with R hand but did well counting large bills and adding amounts in his head. For R hand coordination, worked on flipping over colored disks.  Both activities in standing with slight posterior lean with wt through his heels.  Cues to slightly flex knees. Pt then worked on paper perceptual activities with clock draw, word cancellation, divided attention letter cancellation, number trail making and number/letter trail making work sheet. Pt did very well with all activities, needing mod cues at start of number/letter trail making sheet to keep track of alternating number/ letters.  Otherwise, demonstrated good perceptual skills and attention to task. Pt then ambulated to gym to work on balance strategies with stepping open lunges to use when opening doors/ refrigerator to avoid posterior leans and Posterior LOB.  Pt has decreased R knee control with knee flexion. Focused on L foot placed forward with forward wt shift. Pt seemed to understand rational for exercises.  Pt ambulated back to room. Sat in  recliner with quick release belt and all needs met.    Therapy Documentation Precautions:  Precautions Precautions: Fall Precaution Comments: very HOH Restrictions Weight Bearing Restrictions: No    Vital Signs: Therapy Vitals Temp: 98.1 F (36.7 C) Temp Source: Oral Pulse Rate: (!) 57 Resp: 18 BP: 140/65 mmHg Patient Position (if appropriate): Sitting Oxygen Therapy SpO2: 98 % O2 Device: Not Delivered Pain: Pain Assessment Pain Assessment: No/denies pain ADL:  See Function Navigator for Current Functional Status.   Therapy/Group: Individual Therapy  [place] 07/06/2015, 4:41 PM

## 2015-07-06 NOTE — Progress Notes (Signed)
[place] PHYSICAL MEDICINE & REHABILITATION     PROGRESS NOTE    Subjective/Complaints: Eating breakfast. Vigorous appetite. Denies any pain, headache. Impulsive.   ROS: Denies CP, SOB, N/V/D.Marland Kitchen. Somewhat limited still due to cognition/hearing  Objective: Vital Signs: Blood pressure 167/80, pulse 62, temperature 97.7 F (36.5 C), temperature source Oral, resp. rate 18, height 5\' 6"  (1.676 m), weight 68.9 kg (151 lb 14.4 oz), SpO2 100 %. No results found. No results for input(s): WBC, HGB, HCT, PLT in the last 72 hours.  Recent Labs  07/04/15 0701  NA 129*  K 3.7  CL 93*  GLUCOSE 133*  BUN 15  CREATININE 0.92  CALCIUM 8.6*   CBG (last 3)   Recent Labs  07/05/15 1143 07/05/15 1634 07/05/15 2038  GLUCAP 88 116* 158*    Wt Readings from Last 3 Encounters:  07/06/15 68.9 kg (151 lb 14.4 oz)  06/26/15 70 kg (154 lb 5.2 oz)    Physical Exam:  Constitutional: He appears well-developed and well-nourished. No distress.  HENT: Normocephalic and atraumatic.  Eyes: Conjunctivae are normal.  Cardiovascular: Normal rate and regular rhythm.  Respiratory: Effort normal and breath sounds normal. No stridor.  GI: Soft. Bowel sounds are normal. He exhibits no distension. There is no tenderness.  Musculoskeletal: He exhibits no edema or tenderness.  Neurological: He is alert and oriented.  Extremely HOH despite hearing aids.  Follows simple commands. impulsive Motor: B/l UE: 4+/5 proximal to distal B/l LE 4+/5 proximal to distal Skin: Skin is warm. No rash noted. He is not diaphoretic. No erythema.  Psychiatric: He has a normal mood and affect. Pleasant.   Assessment/Plan: 1. Abnormality of gait/cognitive deficits/functional deficits secondary to traumatic SDH which require 3+ hours per day of interdisciplinary therapy in a comprehensive inpatient rehab setting. Physiatrist is providing close team supervision and 24 hour management of active medical problems listed  below. Physiatrist and rehab team continue to assess barriers to discharge/monitor patient progress toward functional and medical goals.  Function:  Bathing Bathing position   Position: Shower  Bathing parts Body parts bathed by patient: Right arm, Left arm, Abdomen, Chest, Front perineal area, Right upper leg, Left upper leg Body parts bathed by helper: Buttocks, Right lower leg, Left lower leg, Back  Bathing assist Assist Level: Touching or steadying assistance(Pt > 75%)      Upper Body Dressing/Undressing Upper body dressing   What is the patient wearing?: Pull over shirt/dress     Pull over shirt/dress - Perfomed by patient: Thread/unthread right sleeve, Thread/unthread left sleeve, Put head through opening, Pull shirt over trunk          Upper body assist Assist Level: Supervision or verbal cues      Lower Body Dressing/Undressing Lower body dressing   What is the patient wearing?: Pants, Socks, Shoes, Underwear Underwear - Performed by patient: Thread/unthread left underwear leg, Thread/unthread right underwear leg Underwear - Performed by helper: Pull underwear up/down Pants- Performed by patient: Thread/unthread right pants leg, Thread/unthread left pants leg, Pull pants up/down Pants- Performed by helper: Fasten/unfasten pants     Socks - Performed by patient: Don/doff left sock, Don/doff right sock Socks - Performed by helper: Don/doff right sock Shoes - Performed by patient: Don/doff right shoe, Don/doff left shoe, Fasten right, Fasten left            Lower body assist Assist for lower body dressing: Touching or steadying assistance (Pt > 75%)      Toileting Toileting  Toileting steps completed by patient: Performs perineal hygiene Toileting steps completed by helper: Adjust clothing prior to toileting, Adjust clothing after toileting, Performs perineal hygiene Toileting Assistive Devices: Grab bar or rail  Toileting assist Assist level: Touching or  steadying assistance (Pt.75%)   Transfers Chair/bed transfer   Chair/bed transfer method: Ambulatory Chair/bed transfer assist level: Touching or steadying assistance (Pt > 75%) Chair/bed transfer assistive device: Patent attorney     Max distance: 200 Assist level: Touching or steadying assistance (Pt > 75%)   Wheelchair   Type: Manual Max wheelchair distance: 150 Assist Level: Dependent (Pt equals 0%)  Cognition Comprehension Comprehension assist level: Understands basic 90% of the time/cues < 10% of the time  Expression Expression assist level: Expresses basic 90% of the time/requires cueing < 10% of the time.  Social Interaction Social Interaction assist level: Interacts appropriately 90% of the time - Needs monitoring or encouragement for participation or interaction.  Problem Solving Problem solving assist level: Solves basic 90% of the time/requires cueing < 10% of the time  Memory Memory assist level: Recognizes or recalls 90% of the time/requires cueing < 10% of the time   Medical Problem List and Plan: 1. 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  -continue therapies  -he has severe hearing loss but does better when spoken to in lower volume and lower pitch  -vision is also limiting at times. Needs to wear glasses 2. DVT Prophylaxis/Anticoagulation: Mechanical: Sequential compression devices, below knee Bilateral lower extremities 3. Pain Management: Tylenol prn pain. Ice and heat for MSK discomfort.  4. Mood: LCSW to follow for evaluation and support.  5. Neuropsych: This patient is not capable of making decisions on his own behalf. 6. Skin/Wound Care: Routine pressure relief measures.  7. Fluids/Electrolytes/Nutrition: Monitor I/O.  Eating well 8. DMT2: Monitor BS with ac/hs checks. Continue metformin bid 9. HTN: Monitor bid. 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 Will place patient on salt tabs as well as 1200 cc fluid restriction. Held HCTZ and decreased Celexa to 5 mg daily.     Na+   129 on 4/8 (improving).   -recheck tomorrow  -dc salt tabs 11. Cognitive deficits: History of TBI as a teenager.  12. Dyslipidemia: On zocor.    LOS (Days) 5 A FACE TO FACE EVALUATION WAS PERFORMED  SWARTZ,ZACHARY T 07/06/2015 8:59 AM

## 2015-07-07 ENCOUNTER — Inpatient Hospital Stay (HOSPITAL_COMMUNITY): Payer: Medicare Other | Admitting: Physical Therapy

## 2015-07-07 ENCOUNTER — Inpatient Hospital Stay (HOSPITAL_COMMUNITY): Payer: Medicare Other | Admitting: Occupational Therapy

## 2015-07-07 ENCOUNTER — Inpatient Hospital Stay (HOSPITAL_COMMUNITY): Payer: Medicare Other | Admitting: Speech Pathology

## 2015-07-07 LAB — BASIC METABOLIC PANEL
Anion gap: 9 (ref 5–15)
BUN: 13 mg/dL (ref 6–20)
CALCIUM: 9 mg/dL (ref 8.9–10.3)
CO2: 28 mmol/L (ref 22–32)
CREATININE: 0.87 mg/dL (ref 0.61–1.24)
Chloride: 97 mmol/L — ABNORMAL LOW (ref 101–111)
GFR calc Af Amer: >60 mL/min (ref >60)
GLUCOSE: 144 mg/dL — AB (ref 65–99)
POTASSIUM: 4.6 mmol/L (ref 3.5–5.1)
Sodium: 134 mmol/L — ABNORMAL LOW (ref 135–145)

## 2015-07-07 LAB — GLUCOSE, CAPILLARY
GLUCOSE-CAPILLARY: 158 mg/dL — AB (ref 65–99)
GLUCOSE-CAPILLARY: 177 mg/dL — AB (ref 65–99)
Glucose-Capillary: 123 mg/dL — ABNORMAL HIGH (ref 65–99)
Glucose-Capillary: 136 mg/dL — ABNORMAL HIGH (ref 65–99)

## 2015-07-07 NOTE — Progress Notes (Signed)
Physical Therapy Session Note  Patient Details  Name: Steven HaddockFrank Duncan MRN: [id number] Date of Birth: [birth year]  Today's Date: 07/07/2015 PT Individual Time: 1100-1200 PT Individual Time Calculation (min): 60 min   Short Term Goals: Week 1:  PT Short Term Goal 1 (Week 1): Patient will perform transfers with consistent min A.  PT Short Term Goal 2 (Week 1): Patient will ambulate 150 ft with min A.  PT Short Term Goal 3 (Week 1): Patient will negotiate up/down 6" steps using 2 rails with min A. PT Short Term Goal 4 (Week 1): Patient will maintain standing balance during functional task x 3 min with min A.   Skilled Therapeutic Interventions/Progress Updates:   Patient in recliner, ambulated from room > therapy gym using RW with close supervision. Engaged in BLE NMR to promote forward weight shifts in squat position while placing/retrieving cups on tray table outside BOS in front of patient x 2. Trialled shorter RW with patient with improved safety noted as patient kept body closer to RW but upon discussion with patient, he reported that he would not use RW at home after discharge and will only use his cane. Patient also stated that he would use the cane because it's in the car that he will drive to [employer], when patient informed that he cannot drive after discharging from hospital and MD will need to clear him for driving, he stated, "Why?" and could not be convinced. Reinforced recommendation for 24/7 supervision and recommendation for use of RW for improved safety due to history of falls almost daily, treatment team aware. Patient ambulated using SPC in controlled environment x 50 ft + 150 ft + 75 ft with min guard overall and multiple LOB requiring max A to prevent fall with no balance strategies noted. Patient verbalized that RW was safer but continued to state that he will only use SPC, therefore recommend using SPC on rehab unit in preparation for discharge. Patient engaged in Dynavision mode A with  sit <> stand between each light, 2 trials with 12 hits each. Discussed possible outing with RT and OT tomorrow, patient verbalized understanding. Patient left sitting in recliner with quick release belt on and all needs in reach.   Therapy Documentation Precautions:  Precautions Precautions: Fall Precaution Comments: very HOH Restrictions Weight Bearing Restrictions: No Pain: Pain Assessment Pain Assessment: No/denies pain   See Function Navigator for Current Functional Status.   Therapy/Group: Individual Therapy  Kerney ElbeVarner, Donyale Berthold A 07/07/2015, 12:23 PM

## 2015-07-07 NOTE — Progress Notes (Signed)
Physical Therapy Session Note  Patient Details  Name: Steven Duncan MRN: [id number] Date of Birth: 01-12-1929  Today's Date: 07/07/2015 PT Individual Time: 1430-1500 PT Individual Time Calculation (min): 30 min   Short Term Goals: Week 1:  PT Short Term Goal 1 (Week 1): Patient will perform transfers with consistent min A.  PT Short Term Goal 2 (Week 1): Patient will ambulate 150 ft with min A.  PT Short Term Goal 3 (Week 1): Patient will negotiate up/down 6" steps using 2 rails with min A. PT Short Term Goal 4 (Week 1): Patient will maintain standing balance during functional task x 3 min with min A.   Skilled Therapeutic Interventions/Progress Updates:    Pt received resting in recliner, requesting to use bathroom.  Pt transitioned to using [facility] based on pt request and insistence that he will not use a RW at d/c.  Sit<>stand with steady assist and steady assist for pt to ambulate to the bathroom. Pt with 2 major LOB to R and posteriorly when ambulating from recliner to bathroom requiring max assist to prevent fall.  Pt positioned seated on toilet and therapist stepped outside open door and instructed pt to call when ready to stand.  Pt stood without notifying therapist and therapist provided ongoing education for importance of calling for help due to high fall risk.  Pt amb to therapy gym with [facility] and steady assist with occasional mod assist for LOB.  Pt able to recognize that his balance is worse with SPC but inflexible with regard to using RW.  Therapeutic activity focus on anterior weight shift standing on foam wedge reaching for horse shoes 2x5.  Pt amb back to room in same manner as above and positioned with call bell in reach, QRB donned, and needs met.   Therapy Documentation Precautions:  Precautions Precautions: Fall Precaution Comments: very HOH Restrictions Weight Bearing Restrictions: No   See Function Navigator for Current Functional Status.   Therapy/Group: Individual  Therapy  Earnest Conroy Penven-Crew 07/07/2015, 4:52 PM

## 2015-07-07 NOTE — Progress Notes (Signed)
Speech Language Pathology Daily Session Notes  Patient Details  Name: Steven Duncan MRN: [id number] Date of Birth: 06-28-1928  Today's Date: 07/07/2015  Session 1: SLP Individual Time: [id number] SLP Individual Time Calculation (min): 60 min   Session 2: SLP Individual Time: 1333-1400 SLP Individual Time Calculation (min): 27 min  Short Term Goals: Week 1: SLP Short Term Goal 1 (Week 1): Patient will demonstrate alternating attention between functional tasks for 10 minutes with supervision verbal cues for redirection.  SLP Short Term Goal 2 (Week 1): Patient will demonstrate functional problem solving for mildly complex tasks with supervision verbal cues.  SLP Short Term Goal 3 (Week 1): Patient will self-monitor and correct tangents during functional conversations with supervision verbal cues.  SLP Short Term Goal 4 (Week 1): Patient will demonstrate recall of daily information with supervision verbal and visual cues.   Skilled Therapeutic Interventions:  Session 1: Skilled treatment session focused on cognitive goals.  SLP facilitated session by providing intermittent supervision verbal cues for complex problem solving and emergent awareness of errors during a mildly complex task of organizing a 2 time per day pill box. Patient demonstrated selective attention to task for ~30 minutes with supervision verbal cues for redirection. Patient also recalled events from previous therapy session with Mod I. Patient left upright in recliner with all needs within reach and quick release belt in place. Continue with current plan of care.   Session 2: Skilled treatment session focused on cognitive goals. SLP facilitated session by providing extra time and supervision verbal cues for problem solving and organization with a mildly complex money management task. Patient demonstrated selective attention to task in a mildly distracting environment for 20 minutes with Mod I. Patient left sitting upright in  recliner with all needs within reach. Continue with current plan of care.   Function:  Cognition Comprehension Comprehension assist level: Understands complex 90% of the time/cues 10% of the time  Expression   Expression assist level: Expresses basic needs/ideas: With extra time/assistive device  Social Interaction Social Interaction assist level: Interacts appropriately 90% of the time - Needs monitoring or encouragement for participation or interaction.  Problem Solving Problem solving assist level: Solves complex 90% of the time/cues < 10% of the time  Memory Memory assist level: Recognizes or recalls 90% of the time/requires cueing < 10% of the time    Pain Pain Assessment Pain Assessment: No/denies pain  Therapy/Group: Individual Therapy  Steven Duncan 07/07/2015, 11:01 AM

## 2015-07-07 NOTE — Progress Notes (Signed)
Occupational Therapy Session Note  Patient Details  Name: Steven HaddockFrank Duncan MRN: [id number] Date of Birth: 1928-09-18  Today's Date: 07/07/2015 OT Individual Time: 0700-0800 OT Individual Time Calculation (min): 60 min    Short Term Goals: Week 1:  OT Short Term Goal 1 (Week 1): Pt will complete 2 grooming tasks in standing with min steadying assist OT Short Term Goal 2 (Week 1): Pt will ambulate to gather clothing items with min A and 2 VCs to maintain attention to task OT Short Term Goal 3 (Week 1): Pt will complete toilet transfer with min A and 1 VC for safety awareness  Skilled Therapeutic Interventions/Progress Updates:    Pt engaged in BADL retraining including toileting, bathing at shower level, and dressing with sit<>stand from EOB. Pt amb with RW into bathroom to use toilet prior to transfer to shower bench.  Pt returned to EOB and completed dressing tasks.  Pt exhibited posterior lean when standing at EOB and used the bed against the back of his legs for support.  Pt required verbal cues to take a step forward and not use the bed as support.  Pt noted with decreased sitting balance on EOB when gathering clothing from his duffle bag.  Pt completed grooming tasks while standing at sink.  Pt returned to recliner to eat breakfast.  Pt requires min verbal cues throughout session for safety awareness with sit<>stand and amb with RW. Pt able to self feed without assistance.  Pt exhibits difficulty with opening containers (premorbid) but is able to without assistance.  Focus on activity tolerance, sit<>stand, standing balance, functional amb with RW, and safety awareness to increase independence with BADLs.   Therapy Documentation Precautions:  Precautions Precautions: Fall Precaution Comments: very HOH Restrictions Weight Bearing Restrictions: No Pain:  Pt denies pain  See Function Navigator for Current Functional Status.   Therapy/Group: Individual Therapy  Rich BraveLanier, Rowe Warman  Chappell 07/07/2015, 8:03 AM

## 2015-07-07 NOTE — Progress Notes (Signed)
Occupational Therapy Session Note  Patient Details  Name: Steven Duncan MRN: [id number] Date of Birth: 07/07/1928  Today's Date: 07/07/2015 OT Individual Time: 0930-1000 OT Individual Time Calculation (min): 30 min    Short Term Goals: Week 1:  OT Short Term Goal 1 (Week 1): Pt will complete 2 grooming tasks in standing with min steadying assist OT Short Term Goal 2 (Week 1): Pt will ambulate to gather clothing items with min A and 2 VCs to maintain attention to task OT Short Term Goal 3 (Week 1): Pt will complete toilet transfer with min A and 1 VC for safety awareness  Skilled Therapeutic Interventions/Progress Updates:    Pt seen for OT session focusing on ADL re-training, functional standing balance, and activity tolerance. Pt sitting in recliner upon arrival, agreeable to tx session. Pt completed shaving task standing at sink with CGA for balance. Pt required mod A for balance when reaching to floor to obtain item. Pt with decreased insight into deficits and poor safety awareness during functional tasks.    Pt stood at elevated table to complete word jumble in newspaper. Pt required min cuing for attention to task and encouraged pt to stand without UE support, however, pt with difficulty following verbal directions to stand without UE support. Pt tolerated ~20 minutes of static standing during session.    He returned to recliner at end of session, left with quick release belt donned, and all needs in reach.   Therapy Documentation Precautions:  Precautions Precautions: Fall Precaution Comments: very HOH Restrictions Weight Bearing Restrictions: No Pain:   No/ denies pain  See Function Navigator for Current Functional Status.   Therapy/Group: Individual Therapy  Lewis, Dianna Ewald C 07/07/2015, 7:09 AM

## 2015-07-07 NOTE — Progress Notes (Signed)
[place] PHYSICAL MEDICINE & REHABILITATION     PROGRESS NOTE    Subjective/Complaints: Sitting up eating breakfast. No new complaints.   ROS: Denies CP, SOB, N/V/D.Marland Kitchen Somewhat limited still due to cognition/hearing  Objective: Vital Signs: Blood pressure 162/81, pulse 64, temperature 98.1 F (36.7 C), temperature source Oral, resp. rate 17, height  (1.676 m), weight 70.7 kg (155 lb 13.8 oz), SpO2 96 %. No results found. No results for input(s): WBC, HGB, HCT, PLT in the last 72 hours.  Recent Labs  07/07/15 0615  NA 134*  K 4.6  CL 97*  GLUCOSE 144*  BUN 13  CREATININE 0.87  CALCIUM 9.0   CBG (last 3)   Recent Labs  07/06/15 1638 07/06/15 2027 07/07/15 0654  GLUCAP 125* 148* 123*    Wt Readings from Last 3 Encounters:  07/07/15 70.7 kg (155 lb 13.8 oz)  06/26/15 70 kg (154 lb 5.2 oz)    Physical Exam:  Constitutional: He appears well-developed and well-nourished. No distress.  HENT: Normocephalic and atraumatic.  Eyes: Conjunctivae are normal.  Cardiovascular: Normal rate and regular rhythm.  Respiratory: Effort normal and breath sounds normal. No stridor.  GI: Soft. Bowel sounds are normal. He exhibits no distension. There is no tenderness.  Musculoskeletal: He exhibits no edema or tenderness.  Neurological: He is alert and oriented.  Extremely HOH despite hearing aids.  Follows simple commands. impulsive Motor: B/l UE: 4+/5 proximal to distal B/l LE 4+/5 proximal to distal Skin: Skin is warm. No rash noted. He is not diaphoretic. No erythema.  Psychiatric: He has a normal mood and affect. Pleasant.   Assessment/Plan: 1. Abnormality of gait/cognitive deficits/functional deficits secondary to traumatic SDH which require 3+ hours per day of interdisciplinary therapy in a comprehensive inpatient rehab setting. Physiatrist is providing close team supervision and 24 hour management of active medical problems listed below. Physiatrist and rehab team  continue to assess barriers to discharge/monitor patient progress toward functional and medical goals.  Function:  Bathing Bathing position   Position: Shower  Bathing parts Body parts bathed by patient: Right arm, Left arm, Abdomen, Chest, Front perineal area, Right upper leg, Left upper leg, Right lower leg, Left lower leg, Buttocks Body parts bathed by helper: Back  Bathing assist Assist Level: Supervision or verbal cues      Upper Body Dressing/Undressing Upper body dressing   What is the patient wearing?: Pull over shirt/dress     Pull over shirt/dress - Perfomed by patient: Thread/unthread right sleeve, Thread/unthread left sleeve, Put head through opening, Pull shirt over trunk          Upper body assist Assist Level: Supervision or verbal cues      Lower Body Dressing/Undressing Lower body dressing   What is the patient wearing?: Underwear, Pants, Socks, Shoes Underwear - Performed by patient: Thread/unthread left underwear leg, Thread/unthread right underwear leg, Pull underwear up/down Underwear - Performed by helper: Pull underwear up/down Pants- Performed by patient: Thread/unthread right pants leg, Thread/unthread left pants leg, Pull pants up/down, Fasten/unfasten pants Pants- Performed by helper: Fasten/unfasten pants     Socks - Performed by patient: Don/doff left sock, Don/doff right sock Socks - Performed by helper: Don/doff right sock Shoes - Performed by patient: Don/doff right shoe, Don/doff left shoe, Fasten right, Fasten left            Lower body assist Assist for lower body dressing: Touching or steadying assistance (Pt > 75%)      Toileting Toileting  Toileting steps completed by patient: Adjust clothing prior to toileting, Performs perineal hygiene, Adjust clothing after toileting Toileting steps completed by helper: Adjust clothing prior to toileting, Adjust clothing after toileting, Performs perineal hygiene Toileting Assistive Devices:  Grab bar or rail  Toileting assist Assist level: Touching or steadying assistance (Pt.75%)   Transfers Chair/bed transfer   Chair/bed transfer method: Squat pivot Chair/bed transfer assist level: Supervision or verbal cues Chair/bed transfer assistive device: Armrests     Locomotion Ambulation     Max distance: 200 Assist level: Supervision or verbal cues   Wheelchair   Type: Manual Max wheelchair distance: 150 Assist Level: Dependent (Pt equals 0%)  Cognition Comprehension Comprehension assist level: Understands complex 90% of the time/cues 10% of the time  Expression Expression assist level: Expresses basic needs/ideas: With extra time/assistive device  Social Interaction Social Interaction assist level: Interacts appropriately 90% of the time - Needs monitoring or encouragement for participation or interaction.  Problem Solving Problem solving assist level: Solves basic 90% of the time/requires cueing < 10% of the time  Memory Memory assist level: Recognizes or recalls 90% of the time/requires cueing < 10% of the time   Medical Problem List and Plan: 1. 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  -continue therapies  -hearing and visual limitations 2. DVT Prophylaxis/Anticoagulation: Mechanical: Sequential compression devices, below knee Bilateral lower extremities 3. Pain Management: Tylenol prn pain. Ice and heat for MSK discomfort.  4. Mood: LCSW to follow for evaluation and support.  5. Neuropsych: This patient is not capable of making decisions on his own behalf. 6. Skin/Wound Care: Routine pressure relief measures.  7. Fluids/Electrolytes/Nutrition: Monitor I/O.  Eating well 8. DMT2: Monitor BS with ac/hs checks. Continue metformin bid  -good control 9. HTN: Monitor bid. 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 Will place  patient on salt tabs as well as 1200 cc fluid restriction. Held HCTZ and decreased Celexa to 5 mg daily.     Na+   134 today  -I personally reviewed the patient's labs today.    -relax FR 11. Cognitive deficits: History of TBI as a teenager.  12. Dyslipidemia: On zocor.    LOS (Days) 6 A FACE TO FACE EVALUATION WAS PERFORMED  Aamirah Salmi T 07/07/2015 8:58 AM

## 2015-07-08 ENCOUNTER — Inpatient Hospital Stay (HOSPITAL_COMMUNITY): Payer: Medicare Other

## 2015-07-08 ENCOUNTER — Inpatient Hospital Stay (HOSPITAL_COMMUNITY): Payer: Medicare Other | Admitting: Physical Therapy

## 2015-07-08 ENCOUNTER — Inpatient Hospital Stay (HOSPITAL_COMMUNITY): Payer: Medicare Other | Admitting: Speech Pathology

## 2015-07-08 LAB — GLUCOSE, CAPILLARY
GLUCOSE-CAPILLARY: 99 mg/dL (ref 65–99)
GLUCOSE-CAPILLARY: 115 mg/dL — AB (ref 65–99)
Glucose-Capillary: 143 mg/dL — ABNORMAL HIGH (ref 65–99)
Glucose-Capillary: 250 mg/dL — ABNORMAL HIGH (ref 65–99)

## 2015-07-08 NOTE — Patient Care Conference (Signed)
Inpatient RehabilitationTeam Conference and Plan of Care Update Date: 07/07/2015   Time: 2:40 PM    Patient Name: Steven HaddockFrank Galano      Medical Record Number: [id number]  Date of Birth: 1928/05/25 Sex: Male         Room/Bed: 4W18C/4W18C-01 Payor Info: Payor: MEDICARE / Plan: MEDICARE PART A AND B / Product Type: *No Product type* /    Admitting Diagnosis: Trauma TBI  Admit Date/Time:  07/01/2015 11:21 AM Admission Comments: No comment available   Primary Diagnosis:  TBI (traumatic brain injury) (HCC) Principal Problem: TBI (traumatic brain injury) [facility](HCC)  Patient Active Problem List   Diagnosis Date Noted  . Orthostatic hypotension   . TBI (traumatic brain injury) (HCC) 07/01/2015  . 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 Easy fatigability   . Abnormality of gait   . Type 2 diabetes mellitus with complication, without long-term current use of insulin (HCC)   . Cerebral salt-wasting   . HLD (hyperlipidemia)   . Acute subdural hematoma (HCC)   . Cognitive deficit as late effect of traumatic brain injury (HCC)   . Benign essential HTN   . Type 2 diabetes mellitus without complication, without long-term current use of insulin (HCC)   . HOH (hard of hearing)   . Hyponatremia   . Acute blood loss anemia   . SDH (subdural hematoma) (HCC) 06/26/2015    Expected Discharge Date: Expected Discharge Date: 07/14/15  Team Members Present: Physician leading conference: Dr. Faith RogueZachary Swartz Social Worker Present: Amada JupiterLucy Trenesha Alcaide, LCSW Nurse Present: Chana Bodeeborah Sharp, RN PT Present: Bayard Huggerebecca Varner, PT OT Present: Roney MansJennifer Smith, OT;Ardis Rowanom Lanier, COTA SLP Present: Feliberto Gottronourtney Payne, SLP PPS Coordinator present : Tora DuckMarie Noel, RN, CRRN     Current Status/Progress Goal Weekly Team Focus  Medical   [place] hx of [place]  improve cognitive awareness, safety  nutrition, bp,    Bowel/Bladder   Continent with Bowel/Bladder  Patient to remain continent [facility]  Toilet patient Q2hrs and as needed   Swallow/Nutrition/ Hydration             ADL's   steady A functional transfers, ambulation, LB bathing/dressing, verbal cues for safety awareness,  supervision overall, mod I UB dressing and grooming  functional amb with RW, standing balance, functional transfers, safety awareness   Mobility   steady A transfers, supervision-min guard using RW, min-max A using SPC due to LOB with inadequate balance reactions  supervision overall  functional mobility training, RLE NMR/control, standing balance, balance strategies, safety, activity tolerance, pt education   Communication             Safety/Cognition/ Behavioral Observations  Supervision-Min A   Supervision  complex problem solving with familair tasks, recall, attention and emergent awareness of errors    Pain   denied any pain or discomfort  <3  Assess pain Q shift and as needed   Skin   Patient skin intact with bruises on the left Neck  Skin to be free of infection/breakdown while in RH  Assess skin Q shift and as needed    Rehab Goals Patient on target to meet rehab goals: Yes *See Care Plan and progress notes for long and short-term goals.  Barriers to Discharge: poor hearing, vision, old injury    Possible Resolutions to Barriers:  supervision at home, continued cognitive remediation, adaptive equipment    Discharge Planning/Teaching Needs:  Home with family, however, still waiting to clarify  if they can provide 24/7 supervision.  TBD   Team Discussion:  Have set supervision goals and feel strongly that this is 24/7 need.  Pt does not show any mental flexibility especially with new recommended safety precautions (i.e. Use of walker).  SW to confirm that family can provide this level of assist.   Revisions to Treatment Plan:  None   Continued Need for Acute Rehabilitation Level of Care: The patient requires daily medical management by a physician with specialized training in physical  medicine and rehabilitation for the following conditions: Daily direction of a multidisciplinary physical rehabilitation program to ensure safe treatment while eliciting the highest outcome that is of practical value to the patient.: Yes Daily medical management of patient stability for increased activity during participation in an intensive rehabilitation regime.: Yes Daily analysis of laboratory values and/or radiology reports with any subsequent need for medication adjustment of medical intervention for : Neurological problems;Other  Alane Hanssen 07/08/2015, 1:24 PM

## 2015-07-08 NOTE — Progress Notes (Signed)
Recreational Therapy Session Note  Patient Details  Name: Steven Duncan MRN: [id number] Date of Birth: [birth year] Today's Date: 07/08/2015  Pt referred by team for participation in TR services- community reintegration.  Pt participated in an outng to [facility] at overall min assist ambulatory level using SPC.  Pt required max cues throughout the outing for decreased safety & cognition.  See outing goal sheet in shadow chart for full details.  [name] 07/08/2015, 11:57 AM

## 2015-07-08 NOTE — Progress Notes (Signed)
[facility] PHYSICAL MEDICINE & REHABILITATION     PROGRESS NOTE    Subjective/Complaints: Sitting up eating breakfast. No new complaints.   ROS: Denies CP, SOB, N/V/D.Marland Kitchen Somewhat limited still due to cognition/hearing  Objective: Vital Signs: Blood pressure 169/72, pulse 55, temperature 97.7 F (36.5 C), temperature source Oral, resp. rate 17, height  (1.676 m), weight 69.9 kg (154 lb 1.6 oz), SpO2 100 %. No results found. No results for input(s): WBC, HGB, HCT, PLT in the last 72 hours.  Recent Labs  07/07/15 0615  NA 134*  K 4.6  CL 97*  GLUCOSE 144*  BUN 13  CREATININE 0.87  CALCIUM 9.0   CBG (last 3)   Recent Labs  07/07/15 1645 07/07/15 2110 07/08/15 0700  GLUCAP 158* 177* 143*    Wt Readings from Last 3 Encounters:  07/08/15 69.9 kg (154 lb 1.6 oz)  06/26/15 70 kg (154 lb 5.2 oz)    Physical Exam:  Constitutional: He appears well-developed and well-nourished. No distress.  HENT: Normocephalic and atraumatic.  Eyes: Conjunctivae are normal.  Cardiovascular: Normal rate and regular rhythm.  Respiratory: Effort normal and breath sounds normal. No stridor.  GI: Soft. Bowel sounds are normal. He exhibits no distension. There is no tenderness.  Musculoskeletal: He exhibits no edema or tenderness.  Neurological: He is alert and oriented.  Extremely HOH despite hearing aids.  Follows simple commands. impulsive Motor: B/l UE: 4+/5 proximal to distal B/l LE 4+/5 proximal to distal Skin: Skin is warm. No rash noted. He is not diaphoretic. No erythema.  Psychiatric: He has a normal mood and affect. Pleasant.   Assessment/Plan: 1. Abnormality of gait/cognitive deficits/functional deficits secondary to traumatic SDH which require 3+ hours per day of interdisciplinary therapy in a comprehensive inpatient rehab setting. Physiatrist is providing close team supervision and 24 hour management of active medical problems listed below. Physiatrist and rehab team  continue to assess barriers to discharge/monitor patient progress toward functional and medical goals.  Function:  Bathing Bathing position   Position: Shower  Bathing parts Body parts bathed by patient: Right arm, Left arm, Abdomen, Chest, Front perineal area, Right upper leg, Left upper leg, Right lower leg, Left lower leg, Buttocks, Back Body parts bathed by helper: Back  Bathing assist Assist Level: Touching or steadying assistance(Pt > 75%)      Upper Body Dressing/Undressing Upper body dressing   What is the patient wearing?: Pull over shirt/dress     Pull over shirt/dress - Perfomed by patient: Thread/unthread right sleeve, Thread/unthread left sleeve, Put head through opening, Pull shirt over trunk          Upper body assist Assist Level: Supervision or verbal cues      Lower Body Dressing/Undressing Lower body dressing   What is the patient wearing?: Underwear, Pants, Socks, Shoes Underwear - Performed by patient: Thread/unthread left underwear leg, Thread/unthread right underwear leg, Pull underwear up/down Underwear - Performed by helper: Pull underwear up/down Pants- Performed by patient: Thread/unthread right pants leg, Thread/unthread left pants leg, Pull pants up/down, Fasten/unfasten pants Pants- Performed by helper: Fasten/unfasten pants     Socks - Performed by patient: Don/doff left sock, Don/doff right sock Socks - Performed by helper: Don/doff right sock Shoes - Performed by patient: Don/doff right shoe, Don/doff left shoe, Fasten right, Fasten left            Lower body assist Assist for lower body dressing: Touching or steadying assistance (Pt > 75%)  Toileting Toileting   Toileting steps completed by patient: Adjust clothing prior to toileting, Performs perineal hygiene, Adjust clothing after toileting Toileting steps completed by helper: Adjust clothing prior to toileting, Adjust clothing after toileting, Performs perineal  hygiene Toileting Assistive Devices: Grab bar or rail  Toileting assist Assist level: Touching or steadying assistance (Pt.75%)   Transfers Chair/bed transfer   Chair/bed transfer method: Ambulatory Chair/bed transfer assist level: Touching or steadying assistance (Pt > 75%) Chair/bed transfer assistive device: Cane, Armrests     Locomotion Ambulation     Max distance: 150 ft Assist level: Moderate assist (Pt 50 - 74%)   Wheelchair   Type: Manual Max wheelchair distance: 150 Assist Level: Dependent (Pt equals 0%)  Cognition Comprehension Comprehension assist level: Understands complex 90% of the time/cues 10% of the time  Expression Expression assist level: Expresses basic needs/ideas: With extra time/assistive device  Social Interaction Social Interaction assist level: Interacts appropriately 90% of the time - Needs monitoring or encouragement for participation or interaction.  Problem Solving Problem solving assist level: Solves complex 90% of the time/cues < 10% of the time  Memory Memory assist level: Recognizes or recalls 90% of the time/requires cueing < 10% of the time   Medical Problem List and Plan: 1. 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  -continue therapies  -pt and family being educated about his fall risk. Really needs supervision at home. 2. DVT Prophylaxis/Anticoagulation: Mechanical: Sequential compression devices, below knee Bilateral lower extremities 3. Pain Management: Tylenol prn pain. Ice and heat prn for muscular discomfort.  4. Mood: LCSW to follow for evaluation and support.  5. Neuropsych: This patient is not capable of making decisions on his own behalf. 6. Skin/Wound Care: Routine pressure relief measures.  7. Fluids/Electrolytes/Nutrition: Monitor I/O.  Eating well 8. DMT2: Monitor BS with ac/hs checks. Continue metformin bid  -good control 9. HTN: Monitor bid. 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 Will place patient on salt tabs as well as 1200 cc fluid restriction. Held HCTZ and decreased Celexa to 5 mg daily.     Na+  Normalizing   -relaxing FR 11. Cognitive deficits: History of TBI as a teenager.  12. Dyslipidemia: On zocor.    LOS (Days) 7 A FACE TO FACE EVALUATION WAS PERFORMED  Mckenzi Buonomo T 07/08/2015 8:54 AM

## 2015-07-08 NOTE — Progress Notes (Signed)
Occupational Therapy Session Note  Patient Details  Name: Steven Duncan MRN: [id number] Date of Birth: [birth year]  Today's Date: 07/08/2015 OT Individual Time: 6213-08650700-0815 OT Individual Time Calculation (min): 75 min    Short Term Goals: Week 1:  OT Short Term Goal 1 (Week 1): Pt will complete 2 grooming tasks in standing with min steadying assist OT Short Term Goal 2 (Week 1): Pt will ambulate to gather clothing items with min A and 2 VCs to maintain attention to task OT Short Term Goal 3 (Week 1): Pt will complete toilet transfer with min A and 1 VC for safety awareness  Skilled Therapeutic Interventions/Progress Updates:    Pt resting in bed upon arrival.  Pt initially engaged in BADL retraining including bathing at shower level and dressing with sit<>stand from EOB.  Pt amb with SPC to bathroom (steady A) and transferred to shower bench for shower.  Pt exhibited LOB X 1 while standing to dry with towel, requiring min A to correct.  Pt required steady A when standing to pull up pants. Pt continues to exhibit a posterior lean when standing with weight on bilateral heels.  Pt completed grooming tasks while standing at sink.  Pt transitioned to dynamic standing tasks and performing squats from EOB without using BUE.  Pt prequired min A for squats.  Pt transferred to recliner with QRB in place to eat breakfast.  Focus on BADL retraining, dynamic standing balance, functional amb with SPC, and safety awareness to increase independence with BADLs.  Therapy Documentation Precautions:  Precautions Precautions: Fall Precaution Comments: very HOH Restrictions Weight Bearing Restrictions: No Pain:  Pt denied pain  See Function Navigator for Current Functional Status.   Therapy/Group: Individual Therapy  Rich BraveLanier, Charnee Turnipseed Chappell 07/08/2015, 8:17 AM

## 2015-07-08 NOTE — Progress Notes (Signed)
Social Work Patient ID: Steven Duncan, male   DOB: 10-Sep-1928, 82 y.o.   MRN: [id number]   Met with pt yesterday afternoon to review team conference as best as I could.  He understands that his targeted d/c date has been set for 4/18 and that team recommends that family provide 24/7 supervision.  Pt very HOH and simply states, "Yeah, I know...they are there with me."  Spoke with daughter, Steven Duncan, via phone to review conf.  Daughter aware that targeted d/c date set for 4/18 and of teams STRONG recommendation that pt have 24/7 supervision due to significant fall risk (and h/o of multiple falls).  Daughter reports that she will be leaving for a week-long cruise this Sat and that she has informed her adult children (who live at home) "...that they will have to take care of him and what he needs."  Attempted to explain the concerns for pt's safety and daughter agrees and points out that "he's stubborn..he does what he wants."  Again, stressed that team recommends 24/7 supervision and that pt/family assume risk of injury or worse if left alone and daughter states she understands.  Awaiting confirmation back from daughter about who/which days we will complete family education.  Will keep team posted.  Alessandria Henken, LCSW

## 2015-07-08 NOTE — Progress Notes (Signed)
Occupational Therapy Note  Patient Details  Name: Steven HaddockFrank Duncan MRN: [id number] Date of Birth: 08/03/1928  Today's Date: 07/08/2015 OT Concurrent Time: 1000-1130 OT Concurrent Time Calculation (min): 90 min  Pt denied pain Concurrent Therapy   Pt participated in an outng to [facility] at overall min assist ambulatory level using SPC. Pt required max cues throughout the outing for decreased safety & cognition. See outing goal sheet in shadow chart for full details.  Steven NeriLanier, [facility] 07/08/2015, 12:16 PM

## 2015-07-08 NOTE — Progress Notes (Signed)
Speech Language Pathology Daily Session Note  Patient Details  Name: Steven HaddockFrank Duncan MRN: [id number] Date of Birth: [birth year]  Today's Date: 07/08/2015 SLP Individual Time: [id number] SLP Individual Time Calculation (min): 40 min  Short Term Goals: Week 1: SLP Short Term Goal 1 (Week 1): Patient will demonstrate alternating attention between functional tasks for 10 minutes with supervision verbal cues for redirection.  SLP Short Term Goal 2 (Week 1): Patient will demonstrate functional problem solving for mildly complex tasks with supervision verbal cues.  SLP Short Term Goal 3 (Week 1): Patient will self-monitor and correct tangents during functional conversations with supervision verbal cues.  SLP Short Term Goal 4 (Week 1): Patient will demonstrate recall of daily information with supervision verbal and visual cues.   Skilled Therapeutic Interventions: Skilled treatment session focused on cognitive goals. SLP facilitated session by providing intermittent supervision question cues for patient to recall events from previous therapy sessions. Patient also participated in a functional conversation in regards to safety awareness and the importance of having 24 hour supervision at home, especially while ambulating. Patient verbalized intellectual awareness but continues to demonstrate difficulty with emergent awareness and overall safety awareness while ambulating during functional tasks. Patient with increased tangents this session and required Min A verbal cues for redirection. Patient left upright in recliner with all needs within reach and quick release belt in place. Continue with current plan of care.    Function:  Cognition Comprehension Comprehension assist level: Understands complex 90% of the time/cues 10% of the time  Expression   Expression assist level: Expresses basic needs/ideas: With extra time/assistive device  Social Interaction Social Interaction assist level: Interacts  appropriately 90% of the time - Needs monitoring or encouragement for participation or interaction.  Problem Solving Problem solving assist level: Solves complex 90% of the time/cues < 10% of the time  Memory Memory assist level: Recognizes or recalls 90% of the time/requires cueing < 10% of the time    Pain No/Denies Pain   Therapy/Group: Individual Therapy  [name] 07/08/2015, 3:48 PM

## 2015-07-08 NOTE — Progress Notes (Signed)
Physical Therapy Session Note  Patient Details  Name: Steven Duncan MRN: [id number] Date of Birth: [birth year]  Today's Date: 07/08/2015 PT Individual Time: 0905-1000 PT Individual Time Calculation (min): 55 min   Short Term Goals: Week 1:  PT Short Term Goal 1 (Week 1): Patient will perform transfers with consistent min A.  PT Short Term Goal 2 (Week 1): Patient will ambulate 150 ft with min A.  PT Short Term Goal 3 (Week 1): Patient will negotiate up/down 6" steps using 2 rails with min A. PT Short Term Goal 4 (Week 1): Patient will maintain standing balance during functional task x 3 min with min A.   Skilled Therapeutic Interventions/Progress Updates:   Patient in recliner upon arrival. Gait training using SPC in controlled environment with min guard overall and up to mod A with LOB when patient distracted in hallway with decreased R foot clearance. NuStep using BUE/BLE at level 4 x 12 min for strengthening and activity tolerance. Patient negotiated obstacle course including weaving between obstacles, on compliant surface, and over thresholds using SPC x 3 with min-mod A overall and verbal cues for safety. When educated on safety patient continues to state, "I know," but demonstrates minimal carryover with mobility. Patient negotiated up/down 8 (6") stairs using 2 rails with supervision and verbal cues for step-to pattern descending leading with RLE as patient's RLE gives out on him when descending leading with LLE, patient verbalized understanding. Engaged in weight shift and balance strategy re-training via [employer] Limits of Stability with max verbal/visual/tactile cues for ankle and hip strategy as patient dependent on head/trunk movement for weight shifting. Patient ambulated back to room and left sitting in recliner with quick release belt on, patient educated on use of wheelchair for energy conservation on outing to reach [employer] but declined transferring to wheelchair at this time, call bell in  reach.   Therapy Documentation Precautions:  Precautions Precautions: Fall Precaution Comments: very HOH Restrictions Weight Bearing Restrictions: No Pain:  Unrated head pain-"A little in the head"  See Function Navigator for Current Functional Status.  Therapy/Group: Individual Therapy  Kerney ElbeVarner, Johnita Palleschi A 07/08/2015, 10:16 AM

## 2015-07-09 ENCOUNTER — Inpatient Hospital Stay (HOSPITAL_COMMUNITY): Payer: Medicare Other

## 2015-07-09 ENCOUNTER — Inpatient Hospital Stay (HOSPITAL_COMMUNITY): Payer: Medicare Other | Admitting: Physical Therapy

## 2015-07-09 ENCOUNTER — Inpatient Hospital Stay (HOSPITAL_COMMUNITY): Payer: Medicare Other | Admitting: Speech Pathology

## 2015-07-09 LAB — GLUCOSE, CAPILLARY
GLUCOSE-CAPILLARY: 128 mg/dL — AB (ref 65–99)
GLUCOSE-CAPILLARY: 224 mg/dL — AB (ref 65–99)
GLUCOSE-CAPILLARY: 46 mg/dL — AB (ref 65–99)
GLUCOSE-CAPILLARY: 49 mg/dL — AB (ref 65–99)
GLUCOSE-CAPILLARY: 54 mg/dL — AB (ref 65–99)
Glucose-Capillary: 149 mg/dL — ABNORMAL HIGH (ref 65–99)
Glucose-Capillary: 185 mg/dL — ABNORMAL HIGH (ref 65–99)

## 2015-07-09 NOTE — Progress Notes (Signed)
Speech Language Pathology Weekly Progress and Session Note  Patient Details  Name: Steven Duncan MRN: [id number] Date of Birth: Jun 15, 1928  Beginning of progress report period: July 02, 2015 End of progress report period: July 09, 2015  Today's Date: 07/09/2015 SLP Individual Time: 1740-8144 SLP Individual Time Calculation (min): 44 min  Short Term Goals: Week 1: SLP Short Term Goal 1 (Week 1): Patient will demonstrate alternating attention between functional tasks for 10 minutes with supervision verbal cues for redirection.  SLP Short Term Goal 1 - Progress (Week 1): Met SLP Short Term Goal 2 (Week 1): Patient will demonstrate functional problem solving for mildly complex tasks with supervision verbal cues.  SLP Short Term Goal 2 - Progress (Week 1): Met SLP Short Term Goal 3 (Week 1): Patient will self-monitor and correct tangents during functional conversations with supervision verbal cues.  SLP Short Term Goal 3 - Progress (Week 1): Met SLP Short Term Goal 4 (Week 1): Patient will demonstrate recall of daily information with supervision verbal and visual cues.  SLP Short Term Goal 4 - Progress (Week 1): Met    New Short Term Goals: Week 2: SLP Short Term Goal 1 (Week 2): Patient will demonstrate complex problem solving with Mod I for familiar tasks.  SLP Short Term Goal 2 (Week 2): Patient will demonstrate recall of daily information with Mod I.  SLP Short Term Goal 3 (Week 2): Patient will self-monitor and correct tangents during functional conversations with Mod I.  SLP Short Term Goal 4 (Week 2): Patient will demonstrate alternating attention between functional tasks for 30 minutes with supervision verbal cues for redirection.   Weekly Progress Updates: Patient has made functional gains and has met 4 of 4 STG's this reporting period due to improved cognitive function. Currently, patient requires overall supervision-Min assist to complete functional and familiar tasks safely in  regards to attention, problem solving and recall. Patient continues to demonstrate decreased safety awareness, especially during ambulation, however, suspect some of this is baseline. Patient and family education is ongoing and patient would benefit from continued skilled SLP intervention to maximize his cognitive function and overall functional independence prior to discharge home.     Intensity: Minumum of 1-2 x/day, 30 to 90 minutes Frequency: 3 to 5 out of 7 days Duration/Length of Stay: 4/18 Treatment/Interventions: Cognitive remediation/compensation;Cueing hierarchy;Functional tasks;Environmental controls;Internal/external aids;Patient/family education;Therapeutic Activities   Daily Session  Skilled Therapeutic Interventions: Skilled treatment session focused on cognitive goals. SLP facilitated session by providing supervision verbal cues for recall of events from previous therapy sessions. SLP also facilitated session by initially providing Min A verbal and visual cues which faded to supervision by end of session for problem solving and organization during a mildly complex, novel task. Patient left upright in recliner with quick release belt in place and all needs within reach. Continue with current plan of care.      Function:   Cognition Comprehension Comprehension assist level: Understands basic 90% of the time/cues < 10% of the time  Expression   Expression assist level: Expresses basic needs/ideas: With extra time/assistive device  Social Interaction Social Interaction assist level: Interacts appropriately 90% of the time - Needs monitoring or encouragement for participation or interaction.  Problem Solving Problem solving assist level: Solves basic 90% of the time/requires cueing < 10% of the time  Memory Memory assist level: Recognizes or recalls 90% of the time/requires cueing < 10% of the time   Pain No/Denies Pain   Therapy/Group: Individual Therapy  Kiri Hinderliter,  Beanca Kiester 07/09/2015, 11:22 AM

## 2015-07-09 NOTE — Progress Notes (Signed)
Physical Therapy Weekly Progress Note  Patient Details  Name: Steven Duncan MRN: [id number] Date of Birth: [birth year]  Beginning of progress report period: July 02, 2015 End of progress report period: July 09, 2015  Today's Date: 07/09/2015 PT Individual Time: 1550-1615 PT Individual Time Calculation (min): 25 min   Patient has met 2 of 4 short term goals. Patient's progress with functional mobility has been inconsistent. Patient continues to state that he will not utilize RW at home after discharge and will use [facility] despite verbalizing intellectual awareness that RW is safer than SPC. Due to switching from RW to [facility], patient demonstrates frequent LOB requiring assistance to prevent falls. Patient with ongoing decreased safety awareness, however, anticipate that this was problematic at baseline as patient reports he fell daily. Strongly recommending close 24/7 supervision with family member keeping eyes on patient at all times, patient and family will benefit from continuing education in preparation for discharge.  Patient continues to demonstrate the following deficits: muscle weakness, muscle joint tightness, decreased endurance, unbalanced muscle activation, decreased safety awareness, decreased memory, decreased standing balance and decreased balance strategies and therefore will continue to benefit from skilled PT intervention to enhance overall performance with activity tolerance, balance, postural control, ability to compensate for deficits, attention, awareness and coordination.  Patient progressing toward long term goals.  Continue plan of care.  PT Short Term Goals Week 1:  PT Short Term Goal 1 (Week 1): Patient will perform transfers with consistent min A.  PT Short Term Goal 1 - Progress (Week 1): Met PT Short Term Goal 2 (Week 1): Patient will ambulate 150 ft with min A.  PT Short Term Goal 2 - Progress (Week 1): Progressing toward goal PT Short Term Goal 3 (Week 1): Patient will  negotiate up/down 6" steps using 2 rails with min A. PT Short Term Goal 3 - Progress (Week 1): Met PT Short Term Goal 4 (Week 1): Patient will maintain standing balance during functional task x 3 min with min A.  PT Short Term Goal 4 - Progress (Week 1): Progressing toward goal Week 2:  PT Short Term Goal 1 (Week 2): = LTGs due to anticipated LOS  Skilled Therapeutic Interventions/Progress Updates:   Patient asleep in bed, aroused to voice. Patient c/o feeling hot, adjusted thermostat and provided ice water upon request. Patient sat edge of bed to don shoes with increased time. Patient ambulated from room > chairs by elevator using SPC with max A overall due to continued decreased RLE clearance and anterior LOB. Patient stating he was still waking up and took 2 pills instead of 1 for headache pain earlier. Patient required prolonged seated rest break and provided with cool wash cloth. Upon returning to room, patient ambulated using SPC with min A and improved foot clearance. Assisted patient with dinner order for tomorrow and patient left sitting in recliner with quick release belt on and needs in reach.    Therapy Documentation Precautions:  Precautions Precautions: Fall Precaution Comments: very HOH Restrictions Weight Bearing Restrictions: No Pain: Pain Assessment Pain Assessment: No/denies pain  See Function Navigator for Current Functional Status.  Therapy/Group: Individual Therapy  Laretta Alstrom 07/09/2015, 4:36 PM

## 2015-07-09 NOTE — Progress Notes (Signed)
Occupational Therapy Note  Patient Details  Name: Steven HaddockFrank Duncan MRN: [id number] Date of Birth: [birth year]  Today's Date: 07/09/2015 OT Individual Time: 1345-1430 OT Individual Time Calculation (min): 45 min   Pt c/o 5/10 headache; RN aware and admin meds during session Individual Therapy  Pt amb with SPC to gym and engaged in task requiring patient to sequentially locate numbered discs in hallway.  Pt exhibited LOB X 3 requiring max A to correct.  On occasion pt acknowledged that he lost his balance but he "wasn't worried" because he knew I would "catch him." Discussed importance of focusing on ambulation to prevent falls.  Pt verbalized understanding but continues to be distracted resulting in LOB.  Pt transitioned to BLE therex on NuStep (10 mins at workload 5). Pt returned to recliner in room with all needs within reach and QRB in place.   Lavone NeriLanier, Danaly Bari [facility] Amb[facility] SurgicenterChappell 07/09/2015, 2:54 PM

## 2015-07-09 NOTE — Progress Notes (Signed)
Physical Therapy Session Note  Patient Details  Name: [name] MRN: [id number] Date of Birth: [birth year]  Today's Date: 07/09/2015 PT Concurrent Time: 0900-1015 PT Concurrent Time Calculation (min): 75 min  Short Term Goals: Week 2:  PT Short Term Goal 1 (Week 2): = LTGs due to anticipated LOS  Skilled Therapeutic Interventions/Progress Updates:   Pt seen for concurrent treatment to address overall gait with SPC, safety with mobility, dynamic balance activities, and functional strengthening and endurance of BLE. Pt performed gait with SPC through obstacle course for simulated home and community mobility training requiring min assist overall due to multiple LOB laterally or posterior with verbal cues to slow speed and pay closer attention to anticipate obstacles. Pt verbalized understanding and when able to slow down, improved, but unable to continue this throughout session. Seated strengthening exercises during rest breaks including marching and LAQ for overall strengthening and BLE to aid with mobility. Nustep for reciprocal movement pattern retraining and strengthening/endurance x 10 min. Pt engaged in [employer]occe ball game with other patient to address balance while holding something in 1 hand, bending down to pick up item off of floor, and dynamic gait to retrieve balls. Rest breaks as needed and required overall min assist with 2 episodes of mod A needed due to LOB when bending over or when trying to sit too suddenly. End of session set up in recliner with safety belt donned and all needs in reach.   Therapy Documentation Precautions:  Precautions Precautions: Fall Precaution Comments: very HOH Restrictions Weight Bearing Restrictions: No   Pain: Pain Assessment Pain Assessment: 0-10 Pain Score: 4  Pain Type: Acute pain Pain Location: Head Pain Orientation: Left Pain Descriptors / Indicators: Aching Pain Frequency: Intermittent Pain Onset: Gradual Patients Stated Pain Goal:  0 Pain Intervention(s): Medication (See eMAR)    See Function Navigator for Current Functional Status.   Therapy/Group: Concurrent Treatment  Karolee StampsGray, Steven [place] Darrol PokeBrescia  Steven Duncan B. Toivo Bordon, PT, DPT  07/09/2015, 11:12 AM

## 2015-07-09 NOTE — Progress Notes (Signed)
[place] PHYSICAL MEDICINE & REHABILITATION     PROGRESS NOTE    Subjective/Complaints: Walking with OT. No new complaints. Denies pain.    ROS: Denies CP, SOB, N/V/D.Marland Kitchen Somewhat limited still due to cognition/hearing  Objective: Vital Signs: Blood pressure 166/80, pulse 60, temperature 98.4 F (36.9 C), temperature source Oral, resp. rate 17, height  (1.676 m), weight 68.4 kg (150 lb 12.7 oz), SpO2 100 %. No results found. No results for input(s): WBC, HGB, HCT, PLT in the last 72 hours.  Recent Labs  07/07/15 0615  NA 134*  K 4.6  CL 97*  GLUCOSE 144*  BUN 13  CREATININE 0.87  CALCIUM 9.0   CBG (last 3)   Recent Labs  07/08/15 1624 07/08/15 2040 07/09/15 0702  GLUCAP 99 115* 128*    Wt Readings from Last 3 Encounters:  07/09/15 68.4 kg (150 lb 12.7 oz)  06/26/15 70 kg (154 lb 5.2 oz)    Physical Exam:  Constitutional: Steven appears well-developed and well-nourished. No distress.  HENT: Normocephalic and atraumatic.  Eyes: Conjunctivae are normal.  Cardiovascular: Normal rate and regular rhythm.  Respiratory: Effort normal and breath sounds normal. No stridor.  GI: Soft. Bowel sounds are normal. Steven exhibits no distension. There is no tenderness.  Musculoskeletal: Steven exhibits no edema or tenderness.  Neurological: Steven is alert and oriented.  Extremely HOH despite hearing aids.  Follows simple commands. impulsive Motor: B/l UE: 4+/5 proximal to distal B/l LE 4+/5 proximal to distal Skin: Skin is warm. No rash noted. Steven is not diaphoretic. No erythema.  Psychiatric: Steven has a normal mood and affect. Pleasant.   Assessment/Plan: 1. Abnormality of gait/cognitive deficits/functional deficits secondary to traumatic SDH which require 3+ hours per day of interdisciplinary therapy in a comprehensive inpatient rehab setting. Physiatrist is providing close team supervision and 24 hour management of active medical problems listed below. Physiatrist and rehab team  continue to assess barriers to discharge/monitor patient progress toward functional and medical goals.  Function:  Bathing Bathing position   Position: Shower  Bathing parts Body parts bathed by patient: Right arm, Left arm, Abdomen, Chest, Front perineal area, Right upper leg, Left upper leg, Right lower leg, Left lower leg, Buttocks, Back Body parts bathed by helper: Back  Bathing assist Assist Level: Touching or steadying assistance(Pt > 75%)      Upper Body Dressing/Undressing Upper body dressing   What is the patient wearing?: Pull over shirt/dress     Pull over shirt/dress - Perfomed by patient: Thread/unthread right sleeve, Thread/unthread left sleeve, Put head through opening, Pull shirt over trunk          Upper body assist Assist Level: Supervision or verbal cues      Lower Body Dressing/Undressing Lower body dressing   What is the patient wearing?: Underwear, Pants, Socks, Shoes Underwear - Performed by patient: Thread/unthread left underwear leg, Thread/unthread right underwear leg, Pull underwear up/down Underwear - Performed by helper: Pull underwear up/down Pants- Performed by patient: Thread/unthread right pants leg, Thread/unthread left pants leg, Pull pants up/down, Fasten/unfasten pants Pants- Performed by helper: Fasten/unfasten pants     Socks - Performed by patient: Don/doff left sock, Don/doff right sock Socks - Performed by helper: Don/doff right sock Shoes - Performed by patient: Don/doff right shoe, Don/doff left shoe, Fasten right, Fasten left            Lower body assist Assist for lower body dressing: Touching or steadying assistance (Pt > 75%)  Toileting Toileting   Toileting steps completed by patient: Adjust clothing prior to toileting, Performs perineal hygiene, Adjust clothing after toileting Toileting steps completed by helper: Adjust clothing prior to toileting, Adjust clothing after toileting, Performs perineal  hygiene Toileting Assistive Devices: Grab bar or rail  Toileting assist Assist level: Supervision or verbal cues   Transfers Chair/bed transfer   Chair/bed transfer method: Ambulatory Chair/bed transfer assist level: Touching or steadying assistance (Pt > 75%) Chair/bed transfer assistive device: Cane, Armrests     Locomotion Ambulation     Max distance: 150 ft Assist level: Moderate assist (Pt 50 - 74%)   Wheelchair   Type: Manual Max wheelchair distance: 150 Assist Level: Dependent (Pt equals 0%)  Cognition Comprehension Comprehension assist level: Understands basic 90% of the time/cues < 10% of the time  Expression Expression assist level: Expresses basic needs/ideas: With extra time/assistive device  Social Interaction Social Interaction assist level: Interacts appropriately 90% of the time - Needs monitoring or encouragement for participation or interaction.  Problem Solving Problem solving assist level: Solves basic 90% of the time/requires cueing < 10% of the time  Memory Memory assist level: Recognizes or recalls 90% of the time/requires cueing < 10% of the time   Medical Problem List and Plan: 1. 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  -continue therapies  -  needs supervision at home. 2. DVT Prophylaxis/Anticoagulation: Mechanical: Sequential compression devices, below knee Bilateral lower extremities 3. Pain Management: Tylenol prn pain. Ice and heat prn for muscular discomfort.  4. Mood: LCSW to follow for evaluation and support.  5. Neuropsych: This patient is not capable of making decisions on his own behalf. 6. Skin/Wound Care: Routine pressure relief measures.  7. Fluids/Electrolytes/Nutrition: Monitor I/O.  Eating well 8. DMT2: Monitor BS with ac/hs checks. Continue metformin bid  -good control 9. HTN: Monitor bid. 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 Will place patient on salt tabs as well as 1200 cc fluid restriction. Held HCTZ and decreased Celexa to 5 mg daily.     Na+  Normalizing   -relaxing FR--recheck sodium Friday 11. Cognitive deficits: History of TBI as a teenager.  12. Dyslipidemia: On zocor.    LOS (Days) 8 A FACE TO FACE EVALUATION WAS PERFORMED  Brionne Mertz T 07/09/2015 8:58 AM

## 2015-07-09 NOTE — Significant Event (Signed)
Hypoglycemic Event  CBG: 49  Treatment: 15 GM carbohydrate snack  Symptoms: None  Follow-up CBG: Time:1705 CBG Result:54  Possible Reasons for Event: Medication regimen: Patient feels this is because he is receiving insulin, which he does not use at home.  Comments/MD notified: Patient given juice and graham crackers, states he is about to eat dinner and he feels fine and this is because he is receiving insulin. Notified Pam Love, PA-C. Will recheck blood sugar 30 minutes after eating meal.   Marlyne BeardsJENNINGS, Rmoni Keplinger G

## 2015-07-09 NOTE — Progress Notes (Signed)
Occupational Therapy Session Note  Patient Details  Name: [name] MRN: [id number] Date of Birth: 03-10-1929  Today's Date: 07/09/2015 OT Individual Time: 7829-56210700-0815 OT Individual Time Calculation (min): 75 min    Short Term Goals: Week 1:  OT Short Term Goal 1 (Week 1): Pt will complete 2 grooming tasks in standing with min steadying assist OT Short Term Goal 2 (Week 1): Pt will ambulate to gather clothing items with min A and 2 VCs to maintain attention to task OT Short Term Goal 3 (Week 1): Pt will complete toilet transfer with min A and 1 VC for safety awareness  Skilled Therapeutic Interventions/Progress Updates:    Pt engaged in BADL retraining including bathing at shower level and dressing with sit<>stand from EOB. Pt continues to require steady A with functional amb with SPC. Pt requires steady A when standing and continues to exhibit bilateral knee hyperextension with posterior lean against EOB. Pt completed grooming tasks at sink using sink to lean against.  Recommended sitting to complete grooming tasks but pt was reluctant. Pt amb with SPC to therapy gym and engaged in BUE/BLE therex on NuStep (10 mins at workload 5). Pt returned to recliner in room with QRB in place and all needs within reach. Focus on activity tolerance, dynamic standing balance, functional amb with SPC, BLE/BUE strengthening, and safety awareness to increase independence with BADLs.  Therapy Documentation Precautions:  Precautions Precautions: Fall Precaution Comments: very HOH Restrictions Weight Bearing Restrictions: No Pain:  Pt denied pain  See Function Navigator for Current Functional Status.   Therapy/Group: Individual Therapy  Rich BraveLanier, Erasto Sleight Chappell 07/09/2015, 8:18 AM

## 2015-07-09 NOTE — Significant Event (Signed)
Hypoglycemic Event  CBG: 54  Treatment: 15 GM carbohydrate snack  Symptoms: None  Follow-up CBG: Time:1820 CBG Result:149  Possible Reasons for Event: Medication regimen: Patient feels it is due to receiving insulin. He does not take insulin at home and does not want to take it anymore.  Comments/MD notified: Marissa NestlePam Love, PA-C notified and patient consumed meal.    Siddhanth Denk G

## 2015-07-10 ENCOUNTER — Inpatient Hospital Stay (HOSPITAL_COMMUNITY): Payer: Medicare Other

## 2015-07-10 ENCOUNTER — Inpatient Hospital Stay (HOSPITAL_COMMUNITY): Payer: Medicare Other | Admitting: Physical Therapy

## 2015-07-10 ENCOUNTER — Inpatient Hospital Stay (HOSPITAL_COMMUNITY): Payer: Medicare Other | Admitting: Occupational Therapy

## 2015-07-10 ENCOUNTER — Inpatient Hospital Stay (HOSPITAL_COMMUNITY): Payer: Medicare Other | Admitting: Speech Pathology

## 2015-07-10 DIAGNOSIS — R350 Frequency of micturition: Secondary | ICD-10-CM

## 2015-07-10 LAB — BASIC METABOLIC PANEL
ANION GAP: 8 (ref 5–15)
BUN: 12 mg/dL (ref 6–20)
CHLORIDE: 95 mmol/L — AB (ref 101–111)
CO2: 30 mmol/L (ref 22–32)
Calcium: 9 mg/dL (ref 8.9–10.3)
Creatinine, Ser: 0.83 mg/dL (ref 0.61–1.24)
GFR calc Af Amer: >60 mL/min (ref >60)
GLUCOSE: 133 mg/dL — AB (ref 65–99)
POTASSIUM: 4.6 mmol/L (ref 3.5–5.1)
Sodium: 133 mmol/L — ABNORMAL LOW (ref 135–145)

## 2015-07-10 LAB — GLUCOSE, CAPILLARY
GLUCOSE-CAPILLARY: 104 mg/dL — AB (ref 65–99)
GLUCOSE-CAPILLARY: 174 mg/dL — AB (ref 65–99)
Glucose-Capillary: 116 mg/dL — ABNORMAL HIGH (ref 65–99)
Glucose-Capillary: 185 mg/dL — ABNORMAL HIGH (ref 65–99)

## 2015-07-10 NOTE — Progress Notes (Signed)
Occupational Therapy Session Note  Patient Details  Name: Steven Duncan MRN: [id number] Date of Birth: 05/13/1928  Today's Date: 07/10/2015 OT Individual Time: 0700-0800 OT Individual Time Calculation (min): 60 min    Short Term Goals: Week 1:  OT Short Term Goal 1 (Week 1): Pt will complete 2 grooming tasks in standing with min steadying assist OT Short Term Goal 2 (Week 1): Pt will ambulate to gather clothing items with min A and 2 VCs to maintain attention to task OT Short Term Goal 3 (Week 1): Pt will complete toilet transfer with min A and 1 VC for safety awareness  Skilled Therapeutic Interventions/Progress Updates:    Pt asleep in bed upon arrival but easily aroused.  Pt engaged in BADL retraining including bathing at shower level, toileting, and dressing with sit<>stand from EOB.  Focus on dynamic standing balance, functional amb with SPC, and safety awareness.  Pt continues to require max verbal cues for safety awareness and frequently attempts to transfer unassisted.  Pt exhibited LOB X 2 during session and required assistance to correct.   Therapy Documentation Precautions:  Precautions Precautions: Fall Precaution Comments: very HOH Restrictions Weight Bearing Restrictions: No Pain:  Pt denies pain  See Function Navigator for Current Functional Status.   Therapy/Group: Individual Therapy  Rich BraveLanier, Elias Dennington Chappell 07/10/2015, 8:04 AM

## 2015-07-10 NOTE — Progress Notes (Signed)
[place] PHYSICAL MEDICINE & REHABILITATION     PROGRESS NOTE    Subjective/Complaints: Complains of urinary frequency. "doesn't know when he is going to go"  Denies urgency, dysuria, fever  ROS: Denies CP, SOB, N/V/D.Marland Kitchen. Somewhat limited still due to cognition/hearing  Objective: Vital Signs: Blood pressure 171/80, pulse 55, temperature 97.5 F (36.4 C), temperature source Oral, resp. rate 18, height 5\' 6"  (1.676 m), weight 70.4 kg (155 lb 3.3 oz), SpO2 100 %. No results found. No results for input(s): WBC, HGB, HCT, PLT in the last 72 hours.  Recent Labs  07/10/15 0533  NA 133*  K 4.6  CL 95*  GLUCOSE 133*  BUN 12  CREATININE 0.83  CALCIUM 9.0   CBG (last 3)   Recent Labs  07/09/15 1819 07/09/15 2145 07/10/15 0705  GLUCAP 149* 185* 116*    Wt Readings from Last 3 Encounters:  07/10/15 70.4 kg (155 lb 3.3 oz)  06/26/15 70 kg (154 lb 5.2 oz)    Physical Exam:  Constitutional: He appears well-developed and well-nourished. No distress.  HENT: Normocephalic and atraumatic.  Eyes: Conjunctivae are normal.  Cardiovascular: Normal rate and regular rhythm.  Respiratory: Effort normal and breath sounds normal. No stridor.  GI: Soft. Bowel sounds are normal. He exhibits no distension. There is no tenderness.  Musculoskeletal: He exhibits no edema or tenderness.  Neurological: He is alert and oriented.  Extremely HOH despite hearing aids.  Follows simple commands. impulsive Motor: B/l UE: 4+/5 proximal to distal B/l LE 4+/5 proximal to distal Skin: Skin is warm. No rash noted. He is not diaphoretic. No erythema.  Psychiatric: He has a normal mood and affect. Pleasant.   Assessment/Plan: 1. Abnormality of gait/cognitive deficits/functional deficits secondary to traumatic SDH which require 3+ hours per day of interdisciplinary therapy in a comprehensive inpatient rehab setting. Physiatrist is providing close team supervision and 24 hour management of active medical  problems listed below. Physiatrist and rehab team continue to assess barriers to discharge/monitor patient progress toward functional and medical goals.  Function:  Bathing Bathing position   Position: Shower  Bathing parts Body parts bathed by patient: Right arm, Left arm, Abdomen, Chest, Front perineal area, Right upper leg, Left upper leg, Right lower leg, Left lower leg, Buttocks, Back Body parts bathed by helper: Back  Bathing assist Assist Level: Touching or steadying assistance(Pt > 75%)      Upper Body Dressing/Undressing Upper body dressing   What is the patient wearing?: Pull over shirt/dress     Pull over shirt/dress - Perfomed by patient: Thread/unthread right sleeve, Thread/unthread left sleeve, Put head through opening, Pull shirt over trunk          Upper body assist Assist Level: Supervision or verbal cues      Lower Body Dressing/Undressing Lower body dressing   What is the patient wearing?: Underwear, Pants, Socks, Shoes Underwear - Performed by patient: Thread/unthread left underwear leg, Thread/unthread right underwear leg, Pull underwear up/down Underwear - Performed by helper: Pull underwear up/down Pants- Performed by patient: Thread/unthread right pants leg, Thread/unthread left pants leg, Pull pants up/down, Fasten/unfasten pants Pants- Performed by helper: Fasten/unfasten pants     Socks - Performed by patient: Don/doff left sock, Don/doff right sock Socks - Performed by helper: Don/doff right sock Shoes - Performed by patient: Don/doff right shoe, Don/doff left shoe, Fasten right, Fasten left            Lower body assist Assist for lower body dressing: Touching or steadying  assistance (Pt > 75%)      Toileting Toileting   Toileting steps completed by patient: Adjust clothing prior to toileting, Performs perineal hygiene, Adjust clothing after toileting Toileting steps completed by helper: Adjust clothing prior to toileting, Adjust clothing  after toileting, Performs perineal hygiene Toileting Assistive Devices: Grab bar or rail  Toileting assist Assist level: Supervision or verbal cues   Transfers Chair/bed transfer   Chair/bed transfer method: Ambulatory Chair/bed transfer assist level: Touching or steadying assistance (Pt > 75%) Chair/bed transfer assistive device: Armrests, Hospital doctor     Max distance: 100 ft Assist level: Maximal assist (Pt 25 - 49%)   Wheelchair   Type: Manual Max wheelchair distance: 150 Assist Level: Dependent (Pt equals 0%)  Cognition Comprehension Comprehension assist level: Understands complex 90% of the time/cues 10% of the time  Expression Expression assist level: Expresses complex 90% of the time/cues < 10% of the time  Social Interaction Social Interaction assist level: Interacts appropriately with others with medication or extra time (anti-anxiety, antidepressant).  Problem Solving Problem solving assist level: Solves complex problems: With extra time  Memory Memory assist level: Complete Independence: No helper   Medical Problem List and Plan: 1. 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  -continue therapies  -decreased safety awareness. 2. DVT Prophylaxis/Anticoagulation: Mechanical: Sequential compression devices, below knee Bilateral lower extremities 3. Pain Management: Tylenol prn pain. Ice and heat prn for muscular discomfort.  4. Mood: LCSW to follow for evaluation and support.  5. Neuropsych: This patient is not capable of making decisions on his own behalf. 6. Skin/Wound Care: Routine pressure relief measures.  7. Fluids/Electrolytes/Nutrition: Monitor I/O.  Eating well 8. DMT2: Monitor BS with ac/hs checks. Continue metformin bid  -good control. 9. HTN: Monitor bid. 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  Will place patient on salt tabs as well as 1200 cc fluid restriction. Held HCTZ and decreased Celexa to 5 mg daily.     Na+  Normalizing   -dc'ed FR. Sodium 133 today---follow up once more on Monday  11. Cognitive deficits: History of TBI as a teenager.  12. Dyslipidemia: On zocor.  13. Bladder: check UA ,CX  -scan for PVRs   LOS (Days) 9 A FACE TO FACE EVALUATION WAS PERFORMED  Quetzally Callas T 07/10/2015 8:46 AM

## 2015-07-10 NOTE — Progress Notes (Signed)
Speech Language Pathology Daily Session Note  Patient Details  Name: Steven HaddockFrank Duncan MRN: [id number] Date of Birth: 1928-04-11  Today's Date: 07/10/2015 SLP Individual Time: 1000-1100 SLP Individual Time Calculation (min): 60 min  Short Term Goals: Week 2: SLP Short Term Goal 1 (Week 2): Patient will demonstrate complex problem solving with Mod I for familiar tasks.  SLP Short Term Goal 2 (Week 2): Patient will demonstrate recall of daily information with Mod I.  SLP Short Term Goal 3 (Week 2): Patient will self-monitor and correct tangents during functional conversations with Mod I.  SLP Short Term Goal 4 (Week 2): Patient will demonstrate alternating attention between functional tasks for 30 minutes with supervision verbal cues for redirection.   Skilled Therapeutic Interventions: Skilled treatment session focused on cognitive goals. SLP facilitated session by providing Min A verbal and question cues for recall of rules to a previously learned task and for functional problem solving with the task. Patient ambulated to the laundry room and required Min A verbal cues for safety with task. Patient also recalled events from previous therapy sessions with supervision question cues. Patient left upright in recliner with all needs within reach. Continue with current plan of care.    Function:   Cognition Comprehension Comprehension assist level: Understands basic 90% of the time/cues < 10% of the time  Expression   Expression assist level: Expresses basic needs/ideas: With extra time/assistive device  Social Interaction Social Interaction assist level: Interacts appropriately 90% of the time - Needs monitoring or encouragement for participation or interaction.  Problem Solving Problem solving assist level: Solves basic 90% of the time/requires cueing < 10% of the time  Memory Memory assist level: Recognizes or recalls 90% of the time/requires cueing < 10% of the time    Pain Pain  Assessment Pain Assessment: No/denies pain  Therapy/Group: Individual Therapy  Steven Duncan 07/10/2015, 12:28 PM

## 2015-07-10 NOTE — Progress Notes (Signed)
Speech Language Pathology Daily Session Note  Patient Details  Name: Steven HaddockFrank Duncan MRN: [id number] Date of Birth: Nov 26, 1928  Today's Date: 07/10/2015 SLP Individual Time: 1330-1415 SLP Individual Time Calculation (min): 45 min  Short Term Goals: Week 2: SLP Short Term Goal 1 (Week 2): Patient will demonstrate complex problem solving with Mod I for familiar tasks.  SLP Short Term Goal 2 (Week 2): Patient will demonstrate recall of daily information with Mod I.  SLP Short Term Goal 3 (Week 2): Patient will self-monitor and correct tangents during functional conversations with Mod I.  SLP Short Term Goal 4 (Week 2): Patient will demonstrate alternating attention between functional tasks for 30 minutes with supervision verbal cues for redirection.   Skilled Therapeutic Interventions: Skilled ST session focused on cognition goals. SLP facilitated session by providing Mod I with complex problem solving for familiar tasks. Pt required Min A verbal cues for alternating attention between functional tasks in a mildly distraction environment. Pt able to recall previous therapies from this day with Mod I. Pt required max verbal and tactile cues for safety when transferring to his wheelchair. He required reminders to lock his recliner and wheelchair before attempting to transfer. Pt impulsively attempted transfer before attending to safety of task. Pt left in recliner with safety belt in place and all needs within reach. Continue current plan of care.   Function:  Cognition Comprehension Comprehension assist level: Understands complex 90% of the time/cues 10% of the time  Expression   Expression assist level: Expresses complex ideas: With extra time/assistive device  Social Interaction Social Interaction assist level: Interacts appropriately 90% of the time - Needs monitoring or encouragement for participation or interaction.  Problem Solving Problem solving assist level: Solves complex 90% of the  time/cues < 10% of the time  Memory Memory assist level: Recognizes or recalls 90% of the time/requires cueing < 10% of the time    Pain    Therapy/Group: Individual Therapy  Steven Duncan 07/10/2015, 3:19 PM

## 2015-07-10 NOTE — Progress Notes (Signed)
Physical Therapy Session Note  Patient Details  Name: Steven Duncan MRN: [id number] Date of Birth: 06-24-1928  Today's Date: 07/10/2015 PT Individual Time: 0900-1000 PT Individual Time Calculation (min): 60 min   Short Term Goals: Week 2:  PT Short Term Goal 1 (Week 2): = LTGs due to anticipated LOS  Skilled Therapeutic Interventions/Progress Updates:   Patient in recliner upon arrival. Gait training from room > laundry room > gym using SPC with close supervision-steady A and LOB with distractions in hallway requiring min A to recover. Patient checked on laundry in washing machine but required second rinse, patient operated unfamiliar machine with increased time. Stair training up/down 12 (6") stairs using rail and SPC with close supervision and step-to pattern, cues to descend leading with RLE to prevent R knee from buckling. Remainder of session focused on balance strategy re-training and RLE NMR in parallel bars with static standing on balance board in anterior/posterior and medial/lateral direction > A/P weight shifts > static standing with eyes closed with UE support as needed to prevent LOB and supervision-max A. Patient instructed in heel raises and mini squats with max multimodal cues for technique and focus on R knee control and keeping R knee slightly flexed to engage musculature instead of locking out knee into hyperextension for stability. Patient ambulated back to room and requesting to brush teeth at sink, handoff to SLP.    Ambulation LTGs downgraded to min A due to slow patient progress and high falls risk.  Therapy Documentation Precautions:  Precautions Precautions: Fall Precaution Comments: very HOH Restrictions Weight Bearing Restrictions: No Pain: Pain Assessment Pain Assessment: No/denies pain   See Function Navigator for Current Functional Status.   Therapy/Group: Individual Therapy  Kerney ElbeVarner, Hymen Arnett A 07/10/2015, 10:42 AM

## 2015-07-10 NOTE — Progress Notes (Signed)
Occupational Therapy Weekly Progress Note  Patient Details  Name: Steven Duncan MRN: [id number] Date of Birth: 1928/10/04  Beginning of progress report period: July 02, 2015 End of progress report period: July 10, 2015     Patient has met 3 of 3 short term goals.  Pt has made steady progress with BADLs during this admission.  Pt continues to require steady A with functional transfers and dynamic standing tasks.  Pt requires max verbal cues for safety awareness.  Pt will require 24 hour supervision after discharge. Pt and family have verbalized understanding of recommendation.   Patient continues to demonstrate the following deficits: abnormal posture, cognitive deficits and muscle weakness (generalized) and therefore will continue to benefit from skilled OT intervention to enhance overall performance with BADL, iADL and Reduce care partner burden.  Patient progressing toward long term goals..  Continue plan of care.  OT Short Term Goals Week 1:  OT Short Term Goal 1 (Week 1): Pt will complete 2 grooming tasks in standing with min steadying assist OT Short Term Goal 1 - Progress (Week 1): Met OT Short Term Goal 2 (Week 1): Pt will ambulate to gather clothing items with min A and 2 VCs to maintain attention to task OT Short Term Goal 2 - Progress (Week 1): Met OT Short Term Goal 3 (Week 1): Pt will complete toilet transfer with min A and 1 VC for safety awareness OT Short Term Goal 3 - Progress (Week 1): Met Week 2:  OT Short Term Goal 1 (Week 2): LTG=STG secondary to ELOS. Continue to progress towards overall supervision LTG  Therapy Documentation Precautions:  Precautions Precautions: Fall Precaution Comments: very HOH Restrictions Weight Bearing Restrictions: No  See Function Navigator for Current Functional Status.    [name] [facility] 07/10/2015, 9:51 AM

## 2015-07-10 NOTE — Progress Notes (Signed)
Occupational Therapy Session Note  Patient Details  Name: Steven Duncan MRN: [id number] Date of Birth: 07-27-1928  Today's Date: 07/10/2015 OT Individual Time:  -   1545-1645  (60 min)      Short Term Goals: Week 1:  OT Short Term Goal 1 (Week 1): Pt will complete 2 grooming tasks in standing with min steadying assist OT Short Term Goal 1 - Progress (Week 1): Met OT Short Term Goal 2 (Week 1): Pt will ambulate to gather clothing items with min A and 2 VCs to maintain attention to task OT Short Term Goal 2 - Progress (Week 1): Met OT Short Term Goal 3 (Week 1): Pt will complete toilet transfer with min A and 1 VC for safety awareness OT Short Term Goal 3 - Progress (Week 1): Met Week 2:  OT Short Term Goal 1 (Week 2): LTG=STG secondary to ELOS. Continue to progress towards overall supervision LTG Week 3:     Skilled Therapeutic Interventions/Progress Updates:    Addressed balance, functional mobility.  Ambulated to gym with min assist for balance and controlled gait  Pt performed standing balance activities using card placement activitiy. He needed mod cues to stand freely and no lean right leg against the mat.  PPt performed kneeling exercises with min to SBA for balance.  Performed PNF patterns with contract relax.  Ppt ambulated to [place] room and checked clothes in dryer which dryer is broken.  Ambulated back to room and left with family.     Therapy Documentation Precautions:  Precautions Precautions: Fall Precaution Comments: very HOH Restrictions Weight Bearing Restrictions: No      Pain:   none        See Function Navigator for Current Functional Status.   Therapy/Group: Individual Therapy  Lisa Roca 07/10/2015, 3:50 PM

## 2015-07-11 ENCOUNTER — Inpatient Hospital Stay (HOSPITAL_COMMUNITY): Payer: Medicare Other | Admitting: Occupational Therapy

## 2015-07-11 LAB — GLUCOSE, CAPILLARY
GLUCOSE-CAPILLARY: 95 mg/dL (ref 65–99)
Glucose-Capillary: 121 mg/dL — ABNORMAL HIGH (ref 65–99)
Glucose-Capillary: 198 mg/dL — ABNORMAL HIGH (ref 65–99)
Glucose-Capillary: 135 mg/dL — ABNORMAL HIGH (ref 65–99)

## 2015-07-11 LAB — URINALYSIS, ROUTINE W REFLEX MICROSCOPIC
BILIRUBIN URINE: NEGATIVE
Glucose, UA: 100 mg/dL — AB
Hgb urine dipstick: NEGATIVE
Ketones, ur: NEGATIVE mg/dL
Leukocytes, UA: NEGATIVE
NITRITE: NEGATIVE
PH: 6 (ref 5.0–8.0)
Protein, ur: NEGATIVE mg/dL
SPECIFIC GRAVITY, URINE: 1.021 (ref 1.005–1.030)

## 2015-07-11 NOTE — Progress Notes (Signed)
[place] PHYSICAL MEDICINE & REHABILITATION     PROGRESS NOTE    Subjective/Complaints: Rested fairly well. Still voiding a lot  ROS: Denies CP, SOB, N/V/D.Marland Kitchen Somewhat limited still due to cognition/hearing  Objective: Vital Signs: Blood pressure 149/77, pulse 72, temperature 97.5 F (36.4 C), temperature source Oral, resp. rate 18, height  (1.676 m), weight 67.5 kg (148 lb 13 oz), SpO2 100 %. No results found. No results for input(s): WBC, HGB, HCT, PLT in the last 72 hours.  Recent Labs  07/10/15 0533  NA 133*  K 4.6  CL 95*  GLUCOSE 133*  BUN 12  CREATININE 0.83  CALCIUM 9.0   CBG (last 3)   Recent Labs  07/10/15 1642 07/10/15 2053 07/11/15 0640  GLUCAP 104* 174* 121*    Wt Readings from Last 3 Encounters:  07/11/15 67.5 kg (148 lb 13 oz)  06/26/15 70 kg (154 lb 5.2 oz)    Physical Exam:  Constitutional: He appears well-developed and well-nourished. No distress.  HENT: Normocephalic and atraumatic.  Eyes: Conjunctivae are normal.  Cardiovascular: Normal rate and regular rhythm.  Respiratory: Effort normal and breath sounds normal. No stridor.  GI: Soft. Bowel sounds are normal. He exhibits no distension. There is no tenderness.  Musculoskeletal: He exhibits no edema or tenderness.  Neurological: He is alert and oriented.  Extremely HOH despite hearing aids.  Follows simple commands. impulsive Motor: B/l UE: 4+/5 proximal to distal B/l LE 4+/5 proximal to distal Skin: Skin is warm. No rash noted. He is not diaphoretic. No erythema.  Psychiatric: He has a normal mood and affect. Pleasant.   Assessment/Plan: 1. Abnormality of gait/cognitive deficits/functional deficits secondary to traumatic SDH which require 3+ hours per day of interdisciplinary therapy in a comprehensive inpatient rehab setting. Physiatrist is providing close team supervision and 24 hour management of active medical problems listed below. Physiatrist and rehab team continue to  assess barriers to discharge/monitor patient progress toward functional and medical goals.  Function:  Bathing Bathing position   Position: Shower  Bathing parts Body parts bathed by patient: Right arm, Left arm, Abdomen, Chest, Front perineal area, Right upper leg, Left upper leg, Right lower leg, Left lower leg, Buttocks, Back Body parts bathed by helper: Back  Bathing assist Assist Level: Touching or steadying assistance(Pt > 75%)      Upper Body Dressing/Undressing Upper body dressing   What is the patient wearing?: Pull over shirt/dress     Pull over shirt/dress - Perfomed by patient: Thread/unthread right sleeve, Thread/unthread left sleeve, Put head through opening, Pull shirt over trunk          Upper body assist Assist Level: Supervision or verbal cues      Lower Body Dressing/Undressing Lower body dressing   What is the patient wearing?: Underwear, Pants, Socks, Shoes Underwear - Performed by patient: Thread/unthread left underwear leg, Thread/unthread right underwear leg, Pull underwear up/down Underwear - Performed by helper: Pull underwear up/down Pants- Performed by patient: Thread/unthread right pants leg, Thread/unthread left pants leg, Pull pants up/down, Fasten/unfasten pants Pants- Performed by helper: Fasten/unfasten pants     Socks - Performed by patient: Don/doff left sock, Don/doff right sock Socks - Performed by helper: Don/doff right sock Shoes - Performed by patient: Don/doff right shoe, Don/doff left shoe, Fasten right, Fasten left            Lower body assist Assist for lower body dressing: Touching or steadying assistance (Pt > 75%)      Toileting  Toileting   Toileting steps completed by patient: Adjust clothing prior to toileting, Performs perineal hygiene, Adjust clothing after toileting Toileting steps completed by helper: Adjust clothing prior to toileting, Adjust clothing after toileting, Performs perineal hygiene Toileting Assistive  Devices: Grab bar or rail  Toileting assist Assist level: Supervision or verbal cues   Transfers Chair/bed transfer   Chair/bed transfer method: Ambulatory Chair/bed transfer assist level: Touching or steadying assistance (Pt > 75%) Chair/bed transfer assistive device: Armrests, Hospital doctorCane     Locomotion Ambulation     Max distance: 150 ft Assist level: Touching or steadying assistance (Pt > 75%)   Wheelchair   Type: Manual Max wheelchair distance: 150 Assist Level: Dependent (Pt equals 0%)  Cognition Comprehension Comprehension assist level: Understands complex 90% of the time/cues 10% of the time  Expression Expression assist level: Expresses complex ideas: With extra time/assistive device  Social Interaction Social Interaction assist level: Interacts appropriately 90% of the time - Needs monitoring or encouragement for participation or interaction.  Problem Solving Problem solving assist level: Solves complex 90% of the time/cues < 10% of the time  Memory Memory assist level: Recognizes or recalls 90% of the time/requires cueing < 10% of the time   Medical Problem List and Plan: 1. 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  -continue therapies  2. DVT Prophylaxis/Anticoagulation: Mechanical: Sequential compression devices, below knee Bilateral lower extremities 3. Pain Management: Tylenol prn pain. Ice and heat prn for muscular discomfort.  4. Mood: LCSW to follow for evaluation and support.  5. Neuropsych: This patient is not capable of making decisions on his own behalf. 6. Skin/Wound Care: Routine pressure relief measures.  7. Fluids/Electrolytes/Nutrition: Monitor I/O.  Eating well 8. DMT2: Monitor BS with ac/hs checks. Continue metformin bid  -good control. 9. HTN: Monitor bid. 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 Will place  patient on salt tabs as well as 1200 cc fluid restriction. Held HCTZ and decreased Celexa to 5 mg daily.     Na+  Normalizing   -dc'ed FR. Sodium 133 today---follow up once more on Monday  11. Cognitive deficits: History of TBI as a teenager.  12. Dyslipidemia: On zocor.  13. Bladder: check UA ,CX  (NOT COLLECTED)  -scan for PVRs   LOS (Days) 10 A FACE TO FACE EVALUATION WAS PERFORMED  Allyne Hebert T 07/11/2015 9:00 AM

## 2015-07-11 NOTE — Progress Notes (Signed)
Occupational Therapy Session Note  Patient Details  Name: Letroy Vazguez MRN: [id number] Date of Birth: 1928/07/27  Today's Date: 07/11/2015 OT Individual Time: 1415-1500 OT Individual Time Calculation (min): 45 min    Short Term Goals: Week 1:  OT Short Term Goal 1 (Week 1): Pt will complete 2 grooming tasks in standing with min steadying assist OT Short Term Goal 1 - Progress (Week 1): Met OT Short Term Goal 2 (Week 1): Pt will ambulate to gather clothing items with min A and 2 VCs to maintain attention to task OT Short Term Goal 2 - Progress (Week 1): Met OT Short Term Goal 3 (Week 1): Pt will complete toilet transfer with min A and 1 VC for safety awareness OT Short Term Goal 3 - Progress (Week 1): Met Week 2:  OT Short Term Goal 1 (Week 2): LTG=STG secondary to ELOS. Continue to progress towards overall supervision LTG    Skilled Therapeutic Interventions/Progress Updates:    Engaged in functional mobility, sit to stand, standing balance, cognitive skills.  Instructed pt to ambulate with slow, controlled movements during activity.  Ambulated to ADL apt.  Engaged in cleaning pot in standing and using LUE for increased strengthening.  Pt stood for 15 minutes.  Engaged in more dynamic balance activities with loading dishwasher and problem solving how to turn it on.  Ppt needed directional cues to find his way back to the room.  He reported that he got in trouble last evening because he got up after calling and went to bathroom.  Reminded pt to think safety.  Left pt in recliner with safety belt on and call bell,phone within reach.     Therapy Documentation Precautions:  Precautions Precautions: Fall Precaution Comments: very HOH Restrictions Weight Bearing Restrictions: No    Therapy Vitals Temp: 97.9 F (36.6 C) Temp Source: Oral Pulse Rate: 62 Resp: 18 BP: (!) 147/71 mmHg Patient Position (if appropriate): Sitting Oxygen Therapy SpO2: 99 % O2 Device: Not Delivered Pain:   none         :    See Function Navigator for Current Functional Status.   Therapy/Group: Individual Therapy  Lisa Roca 07/11/2015, 4:12 PM

## 2015-07-12 ENCOUNTER — Inpatient Hospital Stay (HOSPITAL_COMMUNITY): Payer: Medicare Other | Admitting: Physical Therapy

## 2015-07-12 LAB — GLUCOSE, CAPILLARY
GLUCOSE-CAPILLARY: 125 mg/dL — AB (ref 65–99)
GLUCOSE-CAPILLARY: 147 mg/dL — AB (ref 65–99)
Glucose-Capillary: 81 mg/dL (ref 65–99)
Glucose-Capillary: 190 mg/dL — ABNORMAL HIGH (ref 65–99)

## 2015-07-12 NOTE — Progress Notes (Signed)
[place] PHYSICAL MEDICINE & REHABILITATION     PROGRESS NOTE    Subjective/Complaints: No new problems  ROS: Denies CP, SOB, N/V/D.Marland Kitchen. Somewhat limited still due to cognition/hearing  Objective: Vital Signs: Blood pressure 159/77, pulse 70, temperature 97.5 F (36.4 C), temperature source Oral, resp. rate 17, height 5\' 6"  (1.676 m), weight 66.4 kg (146 lb 6.2 oz), SpO2 95 %. No results found. No results for input(s): WBC, HGB, HCT, PLT in the last 72 hours.  Recent Labs  07/10/15 0533  NA 133*  K 4.6  CL 95*  GLUCOSE 133*  BUN 12  CREATININE 0.83  CALCIUM 9.0   CBG (last 3)   Recent Labs  07/11/15 1658 07/11/15 2042 07/12/15 0636  GLUCAP 198* 135* 125*    Wt Readings from Last 3 Encounters:  07/12/15 66.4 kg (146 lb 6.2 oz)  06/26/15 70 kg (154 lb 5.2 oz)    Physical Exam:  Constitutional: He appears well-developed and well-nourished. No distress.  HENT: Normocephalic and atraumatic.  Eyes: Conjunctivae are normal.  Cardiovascular: Normal rate and regular rhythm.  Respiratory: Effort normal and breath sounds normal. No stridor.  GI: Soft. Bowel sounds are normal. He exhibits no distension. There is no tenderness.  Musculoskeletal: He exhibits no edema or tenderness.  Neurological: He is alert and oriented.  Extremely HOH despite hearing aids.  Follows simple commands. impulsive Motor: B/l UE: 4+/5 proximal to distal B/l LE 4+/5 proximal to distal Skin: Skin is warm. No rash noted. He is not diaphoretic. No erythema.  Psychiatric: He has a normal mood and affect. Pleasant.   Assessment/Plan: 1. Abnormality of gait/cognitive deficits/functional deficits secondary to traumatic SDH which require 3+ hours per day of interdisciplinary therapy in a comprehensive inpatient rehab setting. Physiatrist is providing close team supervision and 24 hour management of active medical problems listed below. Physiatrist and rehab team continue to assess barriers to  discharge/monitor patient progress toward functional and medical goals.  Function:  Bathing Bathing position   Position: Shower  Bathing parts Body parts bathed by patient: Right arm, Left arm, Abdomen, Chest, Front perineal area, Right upper leg, Left upper leg, Right lower leg, Left lower leg, Buttocks, Back Body parts bathed by helper: Back  Bathing assist Assist Level: Touching or steadying assistance(Pt > 75%)      Upper Body Dressing/Undressing Upper body dressing   What is the patient wearing?: Pull over shirt/dress     Pull over shirt/dress - Perfomed by patient: Thread/unthread right sleeve, Thread/unthread left sleeve, Put head through opening, Pull shirt over trunk          Upper body assist Assist Level: Supervision or verbal cues      Lower Body Dressing/Undressing Lower body dressing   What is the patient wearing?: Underwear, Pants, Socks, Shoes Underwear - Performed by patient: Thread/unthread left underwear leg, Thread/unthread right underwear leg, Pull underwear up/down Underwear - Performed by helper: Pull underwear up/down Pants- Performed by patient: Thread/unthread right pants leg, Thread/unthread left pants leg, Pull pants up/down, Fasten/unfasten pants Pants- Performed by helper: Fasten/unfasten pants     Socks - Performed by patient: Don/doff left sock, Don/doff right sock Socks - Performed by helper: Don/doff right sock Shoes - Performed by patient: Don/doff right shoe, Don/doff left shoe, Fasten right, Fasten left            Lower body assist Assist for lower body dressing: Touching or steadying assistance (Pt > 75%)      LexicographerToileting Toileting   Toileting  steps completed by patient: Adjust clothing prior to toileting, Performs perineal hygiene, Adjust clothing after toileting Toileting steps completed by helper: Adjust clothing prior to toileting, Adjust clothing after toileting, Performs perineal hygiene Toileting Assistive Devices: Grab bar  or rail  Toileting assist Assist level: Supervision or verbal cues   Transfers Chair/bed transfer   Chair/bed transfer method: Ambulatory Chair/bed transfer assist level: Touching or steadying assistance (Pt > 75%) Chair/bed transfer assistive device: Armrests, Hospital doctor     Max distance: 150 ft Assist level: Touching or steadying assistance (Pt > 75%)   Wheelchair   Type: Manual Max wheelchair distance: 150 Assist Level: Dependent (Pt equals 0%)  Cognition Comprehension Comprehension assist level: Understands basic 90% of the time/cues < 10% of the time  Expression Expression assist level: Expresses complex ideas: With extra time/assistive device  Social Interaction Social Interaction assist level: Interacts appropriately 90% of the time - Needs monitoring or encouragement for participation or interaction.  Problem Solving Problem solving assist level: Solves basic 90% of the time/requires cueing < 10% of the time  Memory Memory assist level: Recognizes or recalls 90% of the time/requires cueing < 10% of the time   Medical Problem List and Plan: 1. 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  -continue therapies  2. DVT Prophylaxis/Anticoagulation: Mechanical: Sequential compression devices, below knee Bilateral lower extremities 3. Pain Management: Tylenol prn pain. Ice and heat prn for muscular discomfort.  4. Mood: LCSW to follow for evaluation and support.  5. Neuropsych: This patient is not capable of making decisions on his own behalf. 6. Skin/Wound Care: Routine pressure relief measures.  7. Fluids/Electrolytes/Nutrition: Monitor I/O.  Eating well 8. DMT2: Monitor BS with ac/hs checks. Continue metformin bid  -good control. 9. HTN: Monitor bid. 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 Will place patient on  salt tabs as well as 1200 cc fluid restriction. Held HCTZ and decreased Celexa to 5 mg daily.     Na+  Normalizing   -dc'ed FR. Sodium 133  follow up once more on Monday  11. Cognitive deficits: History of TBI as a teenager.  12. Dyslipidemia: On zocor.  13. Bladder: check UA ,CX pending  -scan for PVRs   LOS (Days) 11 A FACE TO FACE EVALUATION WAS PERFORMED  Dwana Garin T 07/12/2015 8:47 AM

## 2015-07-12 NOTE — Progress Notes (Signed)
Physical Therapy Session Note  Patient Details  Name: Steven Duncan MRN: [id number] Date of Birth: 1928/09/18  Today's Date: 07/12/2015 PT Individual Time: 0930-1000 PT Individual Time Calculation (min): 30 min   Short Term Goals: Week 2:  PT Short Term Goal 1 (Week 2): = LTGs due to anticipated LOS  Skilled Interventions/Progress Updates:   Session focused on community and outdoor ambulation using SPC including throughout hospital, on/off elevators, over thresholds, outdoors on brick and concrete surfaces, and up/down hill with intermittent use of railing. Patient required close supervision-min A due to multiple LOB posterior and and anterior when tripping on cane and verbal cues for safety including decreasing pace and smaller R step length. Patient continues to state, "I won't fall," despite multiple LOB which would have resulted in falls if PT not there to "catch" him. Patient demonstrated some intellectual awareness of high falls risk, however, stating, "I've been thinking a lot about it and maybe I should use the walker," PT reinforcing recommendation to use RW at discharge to improve patient safety. Will continue to reinforce. Patient left sitting in recliner with quick release belt on and needs in reach.   Therapy Documentation Precautions:  Precautions Precautions: Fall Precaution Comments: very HOH Restrictions Weight Bearing Restrictions: No Pain: Pain Assessment Pain Assessment: No/denies pain Pain Score: 0-No pain   See Function Navigator for Current Functional Status.   Therapy/Group: Individual Therapy  Kerney ElbeVarner, Lakeena Downie A 07/12/2015, 10:07 AM

## 2015-07-13 ENCOUNTER — Inpatient Hospital Stay (HOSPITAL_COMMUNITY): Payer: Medicare Other | Admitting: Physical Therapy

## 2015-07-13 ENCOUNTER — Inpatient Hospital Stay (HOSPITAL_COMMUNITY): Payer: Medicare Other

## 2015-07-13 ENCOUNTER — Inpatient Hospital Stay (HOSPITAL_COMMUNITY): Payer: Medicare Other | Admitting: Speech Pathology

## 2015-07-13 LAB — GLUCOSE, CAPILLARY
GLUCOSE-CAPILLARY: 145 mg/dL — AB (ref 65–99)
GLUCOSE-CAPILLARY: 142 mg/dL — AB (ref 65–99)
Glucose-Capillary: 96 mg/dL (ref 65–99)
Glucose-Capillary: 146 mg/dL — ABNORMAL HIGH (ref 65–99)

## 2015-07-13 LAB — URINE CULTURE

## 2015-07-13 LAB — BASIC METABOLIC PANEL
Anion gap: 10 (ref 5–15)
BUN: 17 mg/dL (ref 6–20)
CHLORIDE: 96 mmol/L — AB (ref 101–111)
CO2: 28 mmol/L (ref 22–32)
Calcium: 9 mg/dL (ref 8.9–10.3)
Creatinine, Ser: 0.92 mg/dL (ref 0.61–1.24)
GFR calc non Af Amer: >60 mL/min (ref >60)
Glucose, Bld: 138 mg/dL — ABNORMAL HIGH (ref 65–99)
POTASSIUM: 4.3 mmol/L (ref 3.5–5.1)
SODIUM: 134 mmol/L — AB (ref 135–145)

## 2015-07-13 LAB — CBC
HEMATOCRIT: 35.7 % — AB (ref 39.0–52.0)
HEMOGLOBIN: 11.9 g/dL — AB (ref 13.0–17.0)
MCH: 29.1 pg (ref 26.0–34.0)
MCHC: 33.3 g/dL (ref 30.0–36.0)
MCV: 87.3 fL (ref 78.0–100.0)
Platelets: 264 10*3/uL (ref 150–400)
RBC: 4.09 MIL/uL — ABNORMAL LOW (ref 4.22–5.81)
RDW: 13.7 % (ref 11.5–15.5)
WBC: 6.2 10*3/uL (ref 4.0–10.5)

## 2015-07-13 NOTE — Progress Notes (Signed)
Occupational Therapy Note  Patient Details  Name: Steven Duncan MRN: [id number] Date of Birth: [birth year]  Today's Date: 07/13/2015 OT Individual Time: 1330-1430 OT Individual Time Calculation (min): 60 min   Pt denied pain Individual therapy  Pt resting in recliner with grandson present.  Pt's grandson present for family education.  Pt's grandson verbalized understanding of recommendation for hands-on (steady A) when ambulating.  Pt's grandson verbalized understanding of recommendation for 24 hour supervision.  Pt amb with RW to ADL apartment to practice simulated walk-in shower transfers.  Pt exhibited reluctance practicing transfer recommended method stating that his shower was different at home. Pt's grandson verbalized understanding of recommendation.  Recommended pt practice shower transfers with grandson at home before taking a shower.  Pt's grandson required min verbal cues for safety when ambulating with pt.     Lavone NeriLanier, Celita Aron [facility] 07/13/2015, 2:37 PM

## 2015-07-13 NOTE — Progress Notes (Signed)
Occupational Therapy Session Note  Patient Details  Name: Steven HaddockFrank Duncan MRN: [id number] Date of Birth: Apr 15, 1928  Today's Date: 07/13/2015 OT Individual Time: 0700-0800 OT Individual Time Calculation (min): 60 min    Short Term Goals: Week 2:  OT Short Term Goal 1 (Week 2): LTG=STG secondary to ELOS. Continue to progress towards overall supervision LTG  Skilled Therapeutic Interventions/Progress Updates:    Pt engaged in BADL retraining including toilet and shower transfers, toileting, bathing at shower level and dressing with sit<>stand from EOB.  Pt continues to require min verbal cues for safety awareness with transfers. Pt completed all all transfers, bathing, and LB dressing with supervision; UB dressing and grooming tasks at mod I.  Pt verbalized understanding of recommendation for 24 hour supervision after discharge.  Focus on continued BADL retraining, safety awareness, functional transfers, and dynamic standing balance.   Therapy Documentation Precautions:  Precautions Precautions: Fall Precaution Comments: very HOH Restrictions Weight Bearing Restrictions: No Pain:  Pt denied pain  See Function Navigator for Current Functional Status.   Therapy/Group: Individual Therapy  Rich BraveLanier, Okechukwu Regnier Chappell 07/13/2015, 8:05 AM

## 2015-07-13 NOTE — Progress Notes (Signed)
[place] PHYSICAL MEDICINE & REHABILITATION     PROGRESS NOTE    Subjective/Complaints: No new problems. Had a quiet weekend  ROS: Denies CP, SOB, N/V/D.Marland Kitchen Somewhat limited still due to cognition/hearing  Objective: Vital Signs: Blood pressure 163/80, pulse 69, temperature 98 F (36.7 C), temperature source Oral, resp. rate 18, height  (1.676 m), weight 68.04 kg (150 lb), SpO2 99 %. No results found. No results for input(s): WBC, HGB, HCT, PLT in the last 72 hours.  Recent Labs  07/13/15 0451  NA 134*  K 4.3  CL 96*  GLUCOSE 138*  BUN 17  CREATININE 0.92  CALCIUM 9.0   CBG (last 3)   Recent Labs  07/12/15 1621 07/12/15 2134 07/13/15 0632  GLUCAP 190* 147* 145*    Wt Readings from Last 3 Encounters:  07/13/15 68.04 kg (150 lb)  06/26/15 70 kg (154 lb 5.2 oz)    Physical Exam:  Constitutional: He appears well-developed and well-nourished. No distress.  HENT: Normocephalic and atraumatic.  Eyes: Conjunctivae are normal.  Cardiovascular: Normal rate and regular rhythm.  Respiratory: Effort normal and breath sounds normal. No stridor.  GI: Soft. Bowel sounds are normal. He exhibits no distension. There is no tenderness.  Musculoskeletal: He exhibits no edema or tenderness.  Neurological: He is alert and oriented.  Extremely HOH despite hearing aids.  Follows simple commands. impulsive Motor: B/l UE: 4+/5 proximal to distal B/l LE 4+/5 proximal to distal Skin: Skin is warm. No rash noted. He is not diaphoretic. No erythema.  Psychiatric: He has a normal mood and affect. Pleasant.   Assessment/Plan: 1. Abnormality of gait/cognitive deficits/functional deficits secondary to traumatic SDH which require 3+ hours per day of interdisciplinary therapy in a comprehensive inpatient rehab setting. Physiatrist is providing close team supervision and 24 hour management of active medical problems listed below. Physiatrist and rehab team continue to assess barriers  to discharge/monitor patient progress toward functional and medical goals.  Function:  Bathing Bathing position   Position: Shower  Bathing parts Body parts bathed by patient: Right arm, Left arm, Abdomen, Chest, Front perineal area, Right upper leg, Left upper leg, Right lower leg, Left lower leg, Buttocks, Back Body parts bathed by helper: Back  Bathing assist Assist Level: Supervision or verbal cues      Upper Body Dressing/Undressing Upper body dressing   What is the patient wearing?: Pull over shirt/dress     Pull over shirt/dress - Perfomed by patient: Thread/unthread right sleeve, Thread/unthread left sleeve, Put head through opening, Pull shirt over trunk          Upper body assist Assist Level: No help, No cues      Lower Body Dressing/Undressing Lower body dressing   What is the patient wearing?: Underwear, Pants, Socks, Shoes Underwear - Performed by patient: Thread/unthread left underwear leg, Thread/unthread right underwear leg, Pull underwear up/down Underwear - Performed by helper: Pull underwear up/down Pants- Performed by patient: Thread/unthread right pants leg, Thread/unthread left pants leg, Pull pants up/down, Fasten/unfasten pants Pants- Performed by helper: Fasten/unfasten pants     Socks - Performed by patient: Don/doff left sock, Don/doff right sock Socks - Performed by helper: Don/doff right sock Shoes - Performed by patient: Don/doff right shoe, Don/doff left shoe, Fasten right, Fasten left            Lower body assist Assist for lower body dressing: Supervision or verbal cues      Toileting Toileting   Toileting steps completed by patient: Adjust  clothing prior to toileting, Performs perineal hygiene, Adjust clothing after toileting Toileting steps completed by helper: Adjust clothing prior to toileting, Adjust clothing after toileting, Performs perineal hygiene Toileting Assistive Devices: Grab bar or rail  Toileting assist Assist level:  Supervision or verbal cues   Transfers Chair/bed transfer   Chair/bed transfer method: Ambulatory Chair/bed transfer assist level: Touching or steadying assistance (Pt > 75%) Chair/bed transfer assistive device: Armrests, Hospital doctorCane     Locomotion Ambulation     Max distance: 500 ft Assist level: Touching or steadying assistance (Pt > 75%)   Wheelchair   Type: Manual Max wheelchair distance: 150 Assist Level: Dependent (Pt equals 0%)  Cognition Comprehension Comprehension assist level: Understands complex 90% of the time/cues 10% of the time  Expression Expression assist level: Expresses complex ideas: With extra time/assistive device  Social Interaction Social Interaction assist level: Interacts appropriately 90% of the time - Needs monitoring or encouragement for participation or interaction.  Problem Solving Problem solving assist level: Solves basic 90% of the time/requires cueing < 10% of the time  Memory Memory assist level: Recognizes or recalls 90% of the time/requires cueing < 10% of the time   Medical Problem List and Plan: 1. 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  -continue therapies  -pt showing some improvement in his awareness at times  2. DVT Prophylaxis/Anticoagulation: Mechanical: Sequential compression devices, below knee Bilateral lower extremities 3. Pain Management: Tylenol prn pain. Ice and heat prn for muscular discomfort.  4. Mood: LCSW to follow for evaluation and support.  5. Neuropsych: This patient is not capable of making decisions on his own behalf. 6. Skin/Wound Care: Routine pressure relief measures.  7. Fluids/Electrolytes/Nutrition: Monitor I/O.  Eating well 8. DMT2: Monitor BS with ac/hs checks. Continue metformin bid  -good control. 9. HTN: Monitor bid. 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 Will  place patient on salt tabs as well as 1200 cc fluid restriction. Held HCTZ and decreased Celexa to 5 mg daily.         -dc'ed FR. Sodium 134 today---holding steady  11. Cognitive deficits: History of TBI as a teenager.  12. Dyslipidemia: On zocor.  13. Bladder: check UA ,CX still pending  -scan for PVRs   LOS (Days) 12 A FACE TO FACE EVALUATION WAS PERFORMED  Steven Duncan T 07/13/2015 8:37 AM

## 2015-07-13 NOTE — Discharge Summary (Signed)
Physician Discharge Summary  Patient ID: Steven Duncan MRN: [id number] DOB/AGE: 29-Jul-1928 82 y.o.  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    Labs:  Basic Metabolic Panel:  Recent Labs Lab 07/07/15 0615 07/10/15 0533 07/13/15 0451  NA 134* 133* 134*  K 4.6 4.6 4.3  CL 97* 95* 96*  CO2 GLUCOSE 144* 133* 138*  BUN CREATININE 0.87 0.83 0.92  CALCIUM 9.0 9.0 9.0    CBC: CBC Latest Ref Rng 07/13/2015 07/02/2015 06/27/2015  WBC 4.0 - 10.5 K/uL 6.2 5.4 5.8  Hemoglobin 13.0 - 17.0 g/dL 11.9(L) 11.3(L) 12.1(L)  Hematocrit 39.0 - 52.0 % 35.7(L) 32.9(L) 36.0(L)  Platelets 150 - 400 K/uL 264 214 175     CBG:  Recent Labs Lab 07/13/15 1139 07/13/15 1702 07/13/15 2057 07/14/15 0643 07/14/15 1126  GLUCAP 96 142* 146* 79 104*    Today's Vitals   07/13/15 2008 07/14/15 0546 07/14/15 0742 07/14/15 0817  BP: 138/70 135/67    Pulse: 72 69    Temp:  98 F (36.7 C)    TempSrc:  Oral    Resp:  16    Height:      Weight:  67.9 kg (149 lb 11.1 oz)    SpO2:  98%    PainSc:   4  0-No pain    Brief HPI:   Steven Duncan is a 82 y.o. right handed male with history of HTN, DMT2, depression, HOH, frequent falls, cognitive deficits who was admitted on 06/26/2015 after mechanical fall while in the shower with positive LOC (out about an hour per patient) and complaints of head and neck pain. Family found patient confused and wandering around the house. CT head revealed small falcine and tentorial subdural hematoma and Dr. Bevely Palmer felt that no surgical  intervention needed. Patient to follow with NS in 3-4 weeks for follow CCT. CT cervical spine showed extensive degenerative C4- T1, post surgical changes C5 and C6, old odontoid and C1 posterior arch fractures. Therapy evaluations done yesterday revealing unsteady gait, confusion and poor safety awareness affecting mobility as well as ability to carry out ADL task. CIR recommended for follow up therapy   Hospital Course: Steven Duncan was admitted to rehab 07/01/2015 for inpatient therapies to consist of PT, ST and OT at least three hours five days a week. Past admission physiatrist, therapy team and rehab RN have worked together to provide customized collaborative inpatient rehab. He was placed on fluid restriction as due to hyponatremia with Na-126 due to cerebral salt wasting due to TBI.  HCTZ was discontinued and Celexa was decreased to  5 mg daily. Lytes were monitored closely and Sodium gradually improved to 133-134 range. Salt tabs were discontinued and  Fluid restrictions were removed on 04/15 and sodium is currently stable. Po intake has been good and he is continent of bowel and bladder. Diabetes has been monitored on ac/hs basis and blood sugars have been controlled on metformin bid. Blood pressures have been monitored bid and has been controlled on Prinivil and metoprolol. He has not had complaints of dizziness  with activity.  He does continue to have balance deficits and is showing some insight into fall risk and safety concerns but he continues to be resistant to using rolling walker consistently.    He did have reports of frequent urination and UA of 04/15 was negative--culture showed 20,000 colonies of diptheroids therefore no treatment needed.  Blood pressures have been stable on current regimen and he is to follow up with primary MD for changes as needed.  He has had improvement in mobility and is currently at supervision level with use of walker. 24 hours supervision is recommended due to  ongoing cognitive as well as safety concerns.  He is currently at supervision with use of RW.  He will continue to receive follow up HHPT via [facility] after discharge.    Rehab course: During patient's stay in rehab weekly team conferences were held to monitor patient's progress, set goals and discuss barriers to discharge. At admission, patient required moderate assistance with self care task and mobility.  He has had improvement in activity tolerance, balance, postural control, as well as ability to compensate for deficits.  Cognitive evaluation revealed tangential speech with impairments in emergent awareness, recall, alternating attention and safety with basic and functional tasks. He has had improvement in activity tolerance, endurance and is showing some improvement in emergent awareness. He is able to perform transfers with supervision but requires min assist for dynamic standing balance. He is able to ambulate 300' with RW and supervision.  Family education was completed regarding all aspects of care and mobility.   Disposition: 01-Home or Self Care  Diet: Diabetic diet.   Special Instructions: 1. Check blood sugars twice a day before meals.  2.  Need to use walker for ambulation.  3. Contact primary MD for follow up post hospital appointment in 2-3 weeks.  4. Do not use aspirin or other blood thinners.        Discharge Instructions    Ambulatory referral to Physical Medicine Rehab    Complete by:  As directed   1-2 week follow up post TBI/ moderate complexity.             Medication List    STOP taking these medications        aspirin 81 MG tablet     FISH OIL + D3 PO     hydrochlorothiazide 25 MG tablet  Commonly known as:  HYDRODIURIL      TAKE these medications        acetaminophen 325 MG tablet  Commonly known as:  TYLENOL  Take 2 tablets (650 mg total) by mouth every 6 (six) hours as needed for mild pain or headache.     amitriptyline 10 MG tablet   Commonly known as:  ELAVIL  Take 10 mg by mouth at bedtime.     citalopram 10 MG tablet  Commonly known as:  CELEXA  Take 0.5 tablets (5 mg total) by mouth daily.     glucosamine-chondroitin 500-400 MG tablet  Take 1 tablet by mouth 3 (three) times daily.     glucose blood test strip  1 each by Other route 2 (two) times daily before a meal.     Lancets Misc  by Does not apply route.     lisinopril 40 MG tablet  Commonly known as:  PRINIVIL,ZESTRIL  Take 0.5 tablets (20 mg total) by mouth daily.     metFORMIN 500 MG tablet  Commonly known as:  GLUCOPHAGE  Take by mouth  2 (two) times daily with a meal.     metoprolol tartrate 25 MG tablet  Commonly known as:  LOPRESSOR  Take 1 tablet (25 mg total) by mouth 2 (two) times daily.     MUSCLE RUB 10-15 % Crea  Apply 1 application topically 3 (three) times daily before meals.     simvastatin 40 MG tablet  Commonly known as:  ZOCOR  Take 40 mg by mouth every evening.        Follow-up Information    Follow up with Ranelle OysterSWARTZ,ZACHARY T, MD.   Specialty:  Physical Medicine and Rehabilitation   Why:  office will call you for follow up appointment   Contact information:   [address], [address] [place] [address] [phone]       Follow up with Loura HaltBenjamin Jared Ditty, MD. Call today.   Specialty:  Neurosurgery   Why:  for follow up appointment   Contact information:   [address] [place] [address] [phone]       Signed: Jacquelynn CreeLove, Pamela S 07/15/2015, 10:36 AM

## 2015-07-13 NOTE — Plan of Care (Signed)
Problem: RH Balance Goal: LTG Patient will maintain dynamic standing balance (PT) LTG: Patient will maintain dynamic standing balance with assistance during mobility activities (PT)  Outcome: Not Met (add Reason) Patient requires min A for dynamic balance.

## 2015-07-13 NOTE — Progress Notes (Signed)
Physical Therapy Discharge Summary  Patient Details  Name: Steven Duncan MRN: [id number] Date of Birth: Aug 23, 1928  Today's Date: 07/13/2015 PT Individual Time: 0900-1000 and 1445-1515 PT Individual Time Calculation (min): 60 min and 30 min   Patient has met 11 of 12 long term goals due to improved activity tolerance, improved balance, improved postural control, increased strength, decreased pain, ability to compensate for deficits, functional use of  right lower extremity, improved attention, improved awareness and improved coordination.  Patient to discharge at an ambulatory level Supervision.   Patient's care partner is independent to provide the necessary physical assistance at discharge.  Reasons goals not met: Patient requires min A for dynamic standing balance.   Recommendation:  Patient will benefit from ongoing skilled PT services in home health setting to continue to advance safe functional mobility, address ongoing impairments in standing balance, strength, activity tolerance, safety awareness, and minimize fall risk.  Equipment: RW  Reasons for discharge: treatment goals met and discharge from hospital  Patient/family agrees with progress made and goals achieved: Yes  Skilled Therapeutic Intervention Treatment 1: Patient overall supervision using RW community distances. See below/function tab for details. Patient demonstrates high fall risk as noted by score of  25/56 on Berg Balance Scale, improved from 13/56 on 07/09/15.  Recommending 24/7 supervision due to falls risk. Patient verbalized understanding and with no further questions/concerns regarding discharge home planned tomorrow.    Treatment 2: Focus on family training with patient and grandson for ambulation, stairs, and curb negotiation using RW with supervision and verbal cues for sequencing and bed mobility with mod I. Patient and grandson educated on f/u HHPT and reinforced recommendation for use of RW at DC and 24/7  supervision, both verbalized understanding.   PT Discharge Precautions/Restrictions Precautions Precautions: Fall Precaution Comments: very HOH Restrictions Weight Bearing Restrictions: No Pain Pain Assessment Pain Assessment: No/denies pain Pain Score: 0-No pain Vision/Perception   No change from baseline  Cognition Overall Cognitive Status: History of cognitive impairments - at baseline Arousal/Alertness: Awake/alert Orientation Level: Oriented X4 Attention: Alternating Sustained Attention: Appears intact Selective Attention: Appears intact Selective Attention Impairment: Verbal basic;Functional basic Alternating Attention: Impaired Alternating Attention Impairment: Verbal basic;Functional basic Memory: Impaired Memory Impairment: Retrieval deficit Awareness: Impaired Awareness Impairment: Emergent impairment Problem Solving: Impaired Problem Solving Impairment: Functional complex Safety/Judgment: Impaired Rancho Los Amigos Scales of Cognitive Functioning: Purposeful/appropriate Sensation Sensation Light Touch: Appears Intact Hot/Cold: Appears Intact Proprioception: Appears Intact Coordination Gross Motor Movements are Fluid and Coordinated: No Fine Motor Movements are Fluid and Coordinated: No Coordination and Movement Description: limited by premorbid RLE injury, decreased [place] and coordination BUE Motor  Motor Motor: Abnormal postural alignment and control  Mobility Bed Mobility Bed Mobility: Supine to Sit;Sit to Supine;Rolling Right;Rolling Left Rolling Right: 6: Modified independent (Device/Increase time) Rolling Left: 6: Modified independent (Device/Increase time) Supine to Sit: 6: Modified independent (Device/Increase time) Sit to Supine: 6: Modified independent (Device/Increase time) Transfers Transfers: Yes Sit to Stand: 5: Supervision;With upper extremity assist Stand to Sit: With upper extremity assist;5: Supervision Stand Pivot Transfers: 5:  Supervision Locomotion  Ambulation Ambulation: Yes Ambulation/Gait Assistance: 5: Supervision Ambulation Distance (Feet): 300 Feet Assistive device: Rolling walker Gait Gait: Yes Gait Pattern: Impaired Gait Pattern: Step-through pattern;Decreased hip/knee flexion - right;Decreased trunk rotation;Trunk flexed;Antalgic;Poor foot clearance - right;Decreased weight shift to right;Right genu recurvatum Gait velocity: decreased Stairs / Additional Locomotion Stairs: Yes Stairs Assistance: 5: Supervision Stairs Assistance Details: Verbal cues for sequencing Stair Management Technique: Two rails;Step to pattern;Forwards Number of Stairs:  12 Height of Stairs: 6 Ramp: 5: Supervision Curb: 4: Min Administrator Mobility: No  Trunk/Postural Assessment  Cervical Assessment Cervical Assessment: Exceptions to WFL (decreased AROM, forward head) Thoracic Assessment Thoracic Assessment: Exceptions to [facility] (forward flexed, decreased mobility) Lumbar Assessment Lumbar Assessment: Exceptions to WFL (decreased AROM, hx back surgery, posterior pelvic tilt) Postural Control Postural Control: Deficits on evaluation Protective Responses: delayed/insufficient but slightly improved from eval  Balance Balance Balance Assessed: Yes Standardized Balance Assessment Standardized Balance Assessment: Berg Balance Test Berg Balance Test Sit to Stand: Able to stand  independently using hands Standing Unsupported: Able to stand 2 minutes with supervision Sitting with Back Unsupported but Feet Supported on Floor or Stool: Able to sit safely and securely 2 minutes Stand to Sit: Sits independently, has uncontrolled descent Transfers: Able to transfer with verbal cueing and /or supervision Standing Unsupported with Eyes Closed: Able to stand 10 seconds with supervision Standing Ubsupported with Feet Together: Needs help to attain position but able to stand for 30 seconds with feet  together From Standing, Reach Forward with Outstretched Arm: Reaches forward but needs supervision From Standing Position, Pick up Object from Floor: Able to pick up shoe, needs supervision From Standing Position, Turn to Look Behind Over each Shoulder: Needs supervision when turning Turn 360 Degrees: Needs close supervision or verbal cueing Standing Unsupported, Alternately Place Feet on Step/Stool: Able to complete >2 steps/needs minimal assist Standing Unsupported, One Foot in Front: Loses balance while stepping or standing Standing on One Leg: Tries to lift leg/unable to hold 3 seconds but remains standing independently Total Score: 25 Static Standing Balance Static Standing - Balance Support: Left upper extremity supported Static Standing - Level of Assistance: 5: Stand by assistance Dynamic Standing Balance Dynamic Standing - Balance Support: Left upper extremity supported Dynamic Standing - Level of Assistance: 4: Min assist Extremity Assessment  RUE Assessment RUE Assessment: Within Functional Limits LUE Assessment LUE Assessment: Within Functional Limits RLE Assessment RLE Assessment: Exceptions to [facility] RLE Strength RLE Overall Strength: Deficits;Due to premorbid status RLE Overall Strength Comments: 4-/5 throughout except 5/5 ankle DF LLE Assessment LLE Assessment: Within Functional Limits (5/5 throughout except 4/5 hip flexion)  See Function Navigator for Current Functional Status.  Carney Living A 07/13/2015, 12:43 PM

## 2015-07-13 NOTE — Progress Notes (Signed)
Speech Language Pathology Daily Session Note  Patient Details  Name: Steven Duncan MRN: [id number] Date of Birth: 1929/02/07  Today's Date: 07/13/2015 SLP Individual Time: 1000-1100 SLP Individual Time Calculation (min): 60 min  Short Term Goals: Week 2: SLP Short Term Goal 1 (Week 2): Patient will demonstrate complex problem solving with Mod I for familiar tasks.  SLP Short Term Goal 2 (Week 2): Patient will demonstrate recall of daily information with Mod I.  SLP Short Term Goal 3 (Week 2): Patient will self-monitor and correct tangents during functional conversations with Mod I.  SLP Short Term Goal 4 (Week 2): Patient will demonstrate alternating attention between functional tasks for 30 minutes with supervision verbal cues for redirection.   Skilled Therapeutic Interventions: Skilled treatment session focused on cognitive goals. SLP facilitated session by administering the MoCA (version 7.3). Patient scored 28/30 points with a score of 26 or above considered normal. Patient demonstrated impairments in the area of short-term recall. Patient ambulated to and from the SLP office with the RW with supervision verbal cues for safety in regards to navigation in a mildly distracting environment. Patient left upright in recliner with quick release belt in place and all needs within reach. Continue with current plan of care.    Function:  Cognition Comprehension Comprehension assist level: Understands complex 90% of the time/cues 10% of the time  Expression   Expression assist level: Expresses complex ideas: With extra time/assistive device  Social Interaction Social Interaction assist level: Interacts appropriately with others with medication or extra time (anti-anxiety, antidepressant).  Problem Solving Problem solving assist level: Solves complex 90% of the time/cues < 10% of the time  Memory Memory assist level: Recognizes or recalls 90% of the time/requires cueing < 10% of the time     Pain No/Denies Pain   Therapy/Group: Individual Therapy  Steven Duncan 07/13/2015, 3:44 PM

## 2015-07-13 NOTE — Progress Notes (Signed)
Occupational Therapy Discharge Summary  Patient Details  Name: Steven Duncan MRN: [id number] Date of Birth: 06/30/1928   Patient has met 12 of 12 long term goals due to improved activity tolerance, improved balance, improved attention and improved awareness.  Pt made steady progress with BADLs during this admission.  Pt requires supervision for BADLs and safety awareness with transfers/mobility.  Pt continues to require min verbal cues for safety awareness with transfers and functional amb with RW.  Pt's grandson has been present during therapy and verbalizes understanding of recommendation for 24 hour supervision.  Patient to discharge at overall Supervision level.  Patient's care partner is independent to provide the necessary physical and cognitive assistance at discharge.     Recommendation:  Patient with no further OT needs at this time. Pt's balance to be addressed by follow up PT.  Equipment: No equipment provided Pt owns shower seat.  Reasons for discharge: treatment goals met and discharge from hospital  Patient/family agrees with progress made and goals achieved: Yes  OT Discharge  Vision/Perception  Vision- History Baseline Vision/History: Wears glasses Wears Glasses: At all times Patient Visual Report: No change from baseline Vision- Assessment Vision Assessment?: No apparent visual deficits  Cognition Overall Cognitive Status: History of cognitive impairments - at baseline Arousal/Alertness: Awake/alert Orientation Level: Oriented X4 Attention: Alternating Sustained Attention: Appears intact Selective Attention: Appears intact Selective Attention Impairment: Verbal basic;Functional basic Alternating Attention: Impaired Alternating Attention Impairment: Verbal basic;Functional basic Memory: Impaired Memory Impairment: Retrieval deficit Awareness: Impaired Awareness Impairment: Emergent impairment Problem Solving: Impaired Problem Solving Impairment: Functional  complex Safety/Judgment: Impaired Rancho Los Amigos Scales of Cognitive Functioning: Purposeful/appropriate Sensation Sensation Light Touch: Appears Intact Hot/Cold: Appears Intact Proprioception: Appears Intact Motor  Motor Motor: Abnormal postural alignment and control Mobility  Bed Mobility Bed Mobility: Supine to Sit;Sit to Supine;Rolling Right;Rolling Left Rolling Right: 6: Modified independent (Device/Increase time) Rolling Left: 6: Modified independent (Device/Increase time) Supine to Sit: 6: Modified independent (Device/Increase time) Sit to Supine: 6: Modified independent (Device/Increase time) Transfers Sit to Stand: 5: Supervision;With upper extremity assist Stand to Sit: With upper extremity assist;5: Supervision  Trunk/Postural Assessment  Cervical Assessment Cervical Assessment: Exceptions to [facility] (forward head) Thoracic Assessment Thoracic Assessment: Exceptions to [facility] (forward flexed) Lumbar Assessment Lumbar Assessment: Within Functional Limits (decreased flexibility secondary to prior surgery) Postural Control Protective Responses: delayed/insufficient but slightly improved from eval  Balance Dynamic Sitting Balance Dynamic Sitting - Balance Support: Feet supported;During functional activity Dynamic Sitting - Level of Assistance: 5: Stand by assistance Extremity/Trunk Assessment RUE Assessment RUE Assessment: Within Functional Limits LUE Assessment LUE Assessment: Within Functional Limits   See Function Navigator for Current Functional Status.  [name] [facility] 07/13/2015, 3:18 PM

## 2015-07-14 LAB — GLUCOSE, CAPILLARY
GLUCOSE-CAPILLARY: 79 mg/dL (ref 65–99)
Glucose-Capillary: 104 mg/dL — ABNORMAL HIGH (ref 65–99)

## 2015-07-14 MED ORDER — GLUCOSE BLOOD VI STRP: 1.0000 | ORAL_STRIP | Freq: Two times a day (BID) | Status: AC

## 2015-07-14 MED ORDER — CITALOPRAM HYDROBROMIDE 10 MG PO TABS: 5.0000 mg | ORAL_TABLET | Freq: Every day | ORAL | Status: AC

## 2015-07-14 MED ORDER — MUSCLE RUB 10-15 % EX CREA: 1.0000 "application " | TOPICAL_CREAM | Freq: Three times a day (TID) | CUTANEOUS | Status: AC

## 2015-07-14 MED ORDER — METOPROLOL TARTRATE 25 MG PO TABS: 25.0000 mg | ORAL_TABLET | Freq: Two times a day (BID) | ORAL | Status: AC

## 2015-07-14 MED ORDER — ACETAMINOPHEN 325 MG PO TABS: 650.0000 mg | ORAL_TABLET | Freq: Four times a day (QID) | ORAL | Status: AC | PRN

## 2015-07-14 MED ORDER — GLUCOSE BLOOD VI STRP: 1.0000 | ORAL_STRIP | Freq: Two times a day (BID) | Status: DC

## 2015-07-14 MED ORDER — LISINOPRIL 40 MG PO TABS: 20.0000 mg | ORAL_TABLET | Freq: Every day | ORAL | Status: AC

## 2015-07-14 NOTE — Progress Notes (Signed)
[place] PHYSICAL MEDICINE & REHABILITATION     PROGRESS NOTE    Subjective/Complaints: Sitting in bed. Comfortable. No pain. Ready to go home!  ROS: Denies CP, SOB, N/V/D.Marland Kitchen. Somewhat limited still due to cognition/hearing  Objective: Vital Signs: Blood pressure 135/67, pulse 69, temperature 98 F (36.7 C), temperature source Oral, resp. rate 16, height 5\' 6"  (1.676 m), weight 67.9 kg (149 lb 11.1 oz), SpO2 98 %. No results found.  Recent Labs  07/13/15 1127  WBC 6.2  HGB 11.9*  HCT 35.7*  PLT 264    Recent Labs  07/13/15 0451  NA 134*  K 4.3  CL 96*  GLUCOSE 138*  BUN 17  CREATININE 0.92  CALCIUM 9.0   CBG (last 3)   Recent Labs  07/13/15 1702 07/13/15 2057 07/14/15 0643  GLUCAP 142* 146* 79    Wt Readings from Last 3 Encounters:  07/14/15 67.9 kg (149 lb 11.1 oz)  06/26/15 70 kg (154 lb 5.2 oz)    Physical Exam:  Constitutional: He appears well-developed and well-nourished. No distress.  HENT: Normocephalic and atraumatic.  Eyes: Conjunctivae are normal.  Cardiovascular: Normal rate and regular rhythm.  Respiratory: Effort normal and breath sounds normal. No stridor.  GI: Soft. Bowel sounds are normal. He exhibits no distension. There is no tenderness.  Musculoskeletal: He exhibits no edema or tenderness.  Neurological: He is alert and oriented.  Extremely HOH despite hearing aids.  Follows simple commands. impulsive Motor: B/l UE: 4+/5 proximal to distal B/l LE 4+/5 proximal to distal Skin: Skin is warm. No rash noted. He is not diaphoretic. No erythema.  Psychiatric: He has a normal mood and affect. Pleasant.   Assessment/Plan: 1. Abnormality of gait/cognitive deficits/functional deficits secondary to traumatic SDH which require 3+ hours per day of interdisciplinary therapy in a comprehensive inpatient rehab setting. Physiatrist is providing close team supervision and 24 hour management of active medical problems listed below. Physiatrist  and rehab team continue to assess barriers to discharge/monitor patient progress toward functional and medical goals.  Function:  Bathing Bathing position   Position: Shower  Bathing parts Body parts bathed by patient: Right arm, Left arm, Abdomen, Chest, Front perineal area, Right upper leg, Left upper leg, Right lower leg, Left lower leg, Buttocks, Back Body parts bathed by helper: Back  Bathing assist Assist Level: Supervision or verbal cues      Upper Body Dressing/Undressing Upper body dressing   What is the patient wearing?: Pull over shirt/dress     Pull over shirt/dress - Perfomed by patient: Thread/unthread right sleeve, Thread/unthread left sleeve, Put head through opening, Pull shirt over trunk          Upper body assist Assist Level: No help, No cues      Lower Body Dressing/Undressing Lower body dressing   What is the patient wearing?: Underwear, Pants, Socks, Shoes Underwear - Performed by patient: Thread/unthread left underwear leg, Thread/unthread right underwear leg, Pull underwear up/down Underwear - Performed by helper: Pull underwear up/down Pants- Performed by patient: Thread/unthread right pants leg, Thread/unthread left pants leg, Pull pants up/down, Fasten/unfasten pants Pants- Performed by helper: Fasten/unfasten pants     Socks - Performed by patient: Don/doff left sock, Don/doff right sock Socks - Performed by helper: Don/doff right sock Shoes - Performed by patient: Don/doff right shoe, Don/doff left shoe, Fasten right, Fasten left            Lower body assist Assist for lower body dressing: Supervision or verbal cues  Toileting Toileting   Toileting steps completed by patient: Adjust clothing prior to toileting, Performs perineal hygiene, Adjust clothing after toileting Toileting steps completed by helper: Adjust clothing prior to toileting, Adjust clothing after toileting, Performs perineal hygiene Toileting Assistive Devices: Grab  bar or rail  Toileting assist Assist level: Supervision or verbal cues   Transfers Chair/bed transfer   Chair/bed transfer method: Ambulatory Chair/bed transfer assist level: Supervision or verbal cues Chair/bed transfer assistive device: Armrests, Patent attorney     Max distance: 300 ft Assist level: Supervision or verbal cues   Wheelchair   Type: Manual Max wheelchair distance: 150 Assist Level: Dependent (Pt equals 0%)  Cognition Comprehension Comprehension assist level: Understands complex 90% of the time/cues 10% of the time  Expression Expression assist level: Expresses complex ideas: With extra time/assistive device  Social Interaction Social Interaction assist level: Interacts appropriately with others with medication or extra time (anti-anxiety, antidepressant).  Problem Solving Problem solving assist level: Solves complex 90% of the time/cues < 10% of the time  Memory Memory assist level: Recognizes or recalls 90% of the time/requires cueing < 10% of the time   Medical Problem List and Plan: 1. 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  -dc today. Supervision level  -discussed safety/awareness today with patient  2. DVT Prophylaxis/Anticoagulation: Mechanical: Sequential compression devices, below knee Bilateral lower extremities 3. Pain Management: Tylenol prn pain. Ice and heat prn for muscular discomfort.  4. Mood: LCSW to follow for evaluation and support.  5. Neuropsych: This patient is not capable of making decisions on his own behalf. 6. Skin/Wound Care: Routine pressure relief measures.  7. Fluids/Electrolytes/Nutrition: Monitor I/O.  Eating well 8. DMT2: Monitor BS with ac/hs checks. Continue metformin bid  -good control. 9. HTN: Monitor bid. 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 Will place  patient on salt tabs as well as 1200 cc fluid restriction. Held HCTZ and decreased Celexa to 5 mg daily.         -dc'ed FR. Sodium 134 today---holding steady  11. Cognitive deficits: History of TBI as a teenager.  12. Dyslipidemia: On zocor.  13. Bladder: check UA ,CX negative      LOS (Days) 13 A FACE TO FACE EVALUATION WAS PERFORMED  Steven Duncan T 07/14/2015 8:50 AM

## 2015-07-14 NOTE — Progress Notes (Signed)
Speech Language Pathology Discharge Summary  Patient Details  Name: Tison Leibold MRN: [id number] Date of Birth: 03/23/1929   Patient has met 4 of 4 long term goals.  Patient to discharge at overall Supervision level.   Reasons goals not met: N/A    Clinical Impression/Discharge Summary: Patient has made functional gains and has met 4 of 4 LTG's this admission due to improved cognitive function. Currently, patient is demonstrating behaviors consistent with a [place] Level VIII and requires overall supervision level cues to complete functional and familiar tasks safely in regards to alternating attention, problem solving, short-term recall of information, emergent awareness and overall safety awareness. Patient education is complete and patient will discharge home with 24 hour supervision from family. Suspect patient is at his baseline level of cognitive functioning, therefore, f/u SLP services is not warranted at this time.   Care Partner:  Caregiver Able to Provide Assistance: Yes  Type of Caregiver Assistance: Physical;Cognitive  Recommendation:  24 hour supervision/assistance;None      Equipment: N/A    Reasons for discharge: Treatment goals met;Discharged from hospital   Patient/Family Agrees with Progress Made and Goals Achieved: Yes     [place], Luana 07/14/2015, 7:12 AM

## 2015-07-14 NOTE — Discharge Instructions (Signed)
Inpatient Rehab Discharge Instructions  Steven HaddockFrank Duncan Discharge date and time:  07/14/15  Activities/Precautions/ Functional Status: Activity: no lifting, driving, or strenuous exercise for till cleared by MD.  Diet: diabetic diet Wound Care: none needed   Functional status:  ___ No restrictions     ___ Walk up steps independently _X__ 24/7 supervision/assistance   ___ Walk up steps with assistance ___ Intermittent supervision/assistance  ___ Bathe/dress independently ___ Walk with walker    _X__ Bathe/dress with assistance ___ Walk Independently    ___ Shower independently _X__ Walk with supervision    ___ Shower with assistance ___ No alcohol     ___ Return to work/school ________    COMMUNITY REFERRALS UPON DISCHARGE:    Home Health:   PT                      Agency:  [facility] Phone: [phone]   Medical Equipment/Items Ordered:  Rolling walker                                                      Agency/Supplier:  [facility] @ [phone]   Special Instructions: 1. Note medications changes. 2. Check blood sugars twice a day. 3. Contact primary MD for follow up post hospital appointment in 2-3 weeks.  4. Do not use aspirin or other blood thinners.    My questions have been answered and I understand these instructions. I will adhere to these goals and the provided educational materials after my discharge from the hospital.  Patient/Caregiver Signature _______________________________ Date __________  Clinician Signature _______________________________________ Date __________  Please bring this form and your medication list with you to all your follow-up doctor's appointments.

## 2015-07-14 NOTE — Progress Notes (Signed)
Social Work  Discharge Note  The overall goal for the admission was met for:   Discharge location: Yes - returning home with daughter and her family  Length of Stay: Yes - 13 days  Discharge activity level: Yes - supervision  Home/community participation: Yes  Services provided included: MD, RD, PT, OT, SLP, RN, TR, Pharmacy and SW  Financial Services: Medicare and Private Insurance: [place]  Follow-up services arranged: Home Health: PT via [facility], DME: rolling walker via [facility] and Patient/Family has no preference for HH/DME agencies  Comments (or additional information):  Have stressed to daughter that pt is a very high risk for fall if left unsupervised.  She states her understanding of this.  Grandson in for training x 2 and also aware of recommendation of 24/7 supervision.  Patient/Family verbalized understanding of follow-up arrangements: Yes  Individual responsible for coordination of the follow-up plan: pt/ daughter  Confirmed correct DME delivered: Lennart Pall 07/14/2015    Myrene Bougher

## 2015-07-14 NOTE — Progress Notes (Signed)
Patient discharged home.  Left floor via wheelchair, escorted by nursing staff and family.  All patient belongings sent with patient including DME.  Patient and family verbalized understanding of discharge instructions as given by Pam Love, PA.  Appears to be in no immediate distress at this time.  Thyra Yinger J, RN  

## 2015-07-16 ENCOUNTER — Telehealth: Payer: Self-pay

## 2015-07-16 NOTE — Telephone Encounter (Signed)
1. Are you/is patient experiencing any problems since coming home? Are there any questions regarding any aspect of care? 2. Are there any questions regarding medications administration/dosing? Are meds being taken as prescribed? Patient should review meds with caller to confirm 3. Have there been any falls? 4. Has Home Health been to the house and/or have they contacted you? If not, have you tried to contact them? Can we help you contact them? 5. Are bowels and bladder emptying properly? Are there any unexpected incontinence issues? If applicable, is patient following bowel/bladder programs? 6. Any fevers, problems with breathing, unexpected pain? 7. Are there any skin problems or new areas of breakdown? 8. Has the patient/family member arranged specialty MD follow up (ie cardiology/neurology/renal/surgical/etc)?  Can we help arrange? 9. Does the patient need any other services or support that we can help arrange? 10. Are caregivers following through as expected in assisting the patient? 11. Has the patient quit smoking, drinking alcohol, or using drugs as recommended?  

## 2015-07-17 NOTE — Telephone Encounter (Signed)
Steven Duncan with [facility] called to let us know that there was a delay in start of care with patient for physical therapy, they could not get in touch with daughter.  They did however do the start of care today.  If you have any questions you can call Steven Duncan at [phone].

## 2015-07-20 ENCOUNTER — Telehealth: Payer: Self-pay

## 2015-07-20 NOTE — Telephone Encounter (Signed)
Roland-Pt with [facility]- called for verbal orders for 3wk4. Verbal orders approved.

## 2015-07-20 NOTE — Telephone Encounter (Signed)
Pt packet mailed.  

## 2015-07-22 ENCOUNTER — Encounter: Payer: Medicare Other | Attending: Physical Medicine & Rehabilitation | Admitting: Physical Medicine & Rehabilitation

## 2015-08-03 DIAGNOSIS — E119 Type 2 diabetes mellitus without complications: Secondary | ICD-10-CM

## 2015-08-03 DIAGNOSIS — S065X3S Traumatic subdural hemorrhage with loss of consciousness of 1 hour to 5 hours 59 minutes, sequela: Secondary | ICD-10-CM | POA: Diagnosis not present

## 2015-08-03 DIAGNOSIS — R2681 Unsteadiness on feet: Secondary | ICD-10-CM | POA: Diagnosis not present

## 2015-08-03 DIAGNOSIS — R419 Unspecified symptoms and signs involving cognitive functions and awareness: Secondary | ICD-10-CM | POA: Diagnosis not present

## 2015-08-03 DIAGNOSIS — W19XXXS Unspecified fall, sequela: Secondary | ICD-10-CM

## 2015-08-03 DIAGNOSIS — I10 Essential (primary) hypertension: Secondary | ICD-10-CM

## 2015-09-11 ENCOUNTER — Telehealth: Payer: Self-pay | Admitting: Physical Medicine & Rehabilitation

## 2015-09-11 NOTE — Telephone Encounter (Signed)
Marda StalkerJoe Johnson PT with [facility] needs to get a verbal order to extend patient 8 more weeks.  Please call him at [phone].

## 2015-09-14 NOTE — Telephone Encounter (Signed)
Spoke with Joe, gave verbal orders per office protocol.

## 2015-09-21 ENCOUNTER — Telehealth: Payer: Self-pay | Admitting: Physical Medicine & Rehabilitation

## 2015-09-21 NOTE — Telephone Encounter (Signed)
Steven StalkerJoe Johnson PT with [facility] wanted to let us know patient had a fall yesterday.  Patient was coming out of bathroom and tripped over dog.  He hit left side of head and does have a black eye, denies any pain.  Any questions please call Joe at [phone].

## 2015-09-24 NOTE — Telephone Encounter (Signed)
FYI

## 2017-07-27 ENCOUNTER — Inpatient Hospital Stay (HOSPITAL_COMMUNITY)
Admission: EM | Admit: 2017-07-27 | Discharge: 2017-08-26 | DRG: 064 | Disposition: E | Payer: Medicare Other | Attending: Internal Medicine | Admitting: Internal Medicine

## 2017-07-27 ENCOUNTER — Encounter (HOSPITAL_COMMUNITY): Payer: Self-pay | Admitting: Emergency Medicine

## 2017-07-27 ENCOUNTER — Emergency Department (HOSPITAL_COMMUNITY): Payer: Medicare Other

## 2017-07-27 DIAGNOSIS — R569 Unspecified convulsions: Secondary | ICD-10-CM | POA: Diagnosis present

## 2017-07-27 DIAGNOSIS — J9601 Acute respiratory failure with hypoxia: Secondary | ICD-10-CM | POA: Diagnosis present

## 2017-07-27 DIAGNOSIS — R402342 Coma scale, best motor response, flexion withdrawal, at arrival to emergency department: Secondary | ICD-10-CM | POA: Diagnosis present

## 2017-07-27 DIAGNOSIS — S069XAA Unspecified intracranial injury with loss of consciousness status unknown, initial encounter: Secondary | ICD-10-CM | POA: Diagnosis present

## 2017-07-27 DIAGNOSIS — I616 Nontraumatic intracerebral hemorrhage, multiple localized: Secondary | ICD-10-CM

## 2017-07-27 DIAGNOSIS — E785 Hyperlipidemia, unspecified: Secondary | ICD-10-CM | POA: Diagnosis present

## 2017-07-27 DIAGNOSIS — Z87891 Personal history of nicotine dependence: Secondary | ICD-10-CM | POA: Diagnosis not present

## 2017-07-27 DIAGNOSIS — Z515 Encounter for palliative care: Secondary | ICD-10-CM | POA: Diagnosis present

## 2017-07-27 DIAGNOSIS — R29742 NIHSS score 42: Secondary | ICD-10-CM | POA: Diagnosis present

## 2017-07-27 DIAGNOSIS — S069XAS Unspecified intracranial injury with loss of consciousness status unknown, sequela: Secondary | ICD-10-CM

## 2017-07-27 DIAGNOSIS — R402212 Coma scale, best verbal response, none, at arrival to emergency department: Secondary | ICD-10-CM | POA: Diagnosis present

## 2017-07-27 DIAGNOSIS — R413 Other amnesia: Secondary | ICD-10-CM | POA: Diagnosis present

## 2017-07-27 DIAGNOSIS — E119 Type 2 diabetes mellitus without complications: Secondary | ICD-10-CM | POA: Diagnosis present

## 2017-07-27 DIAGNOSIS — I10 Essential (primary) hypertension: Secondary | ICD-10-CM | POA: Diagnosis not present

## 2017-07-27 DIAGNOSIS — R4182 Altered mental status, unspecified: Secondary | ICD-10-CM | POA: Diagnosis present

## 2017-07-27 DIAGNOSIS — Z66 Do not resuscitate: Secondary | ICD-10-CM | POA: Diagnosis present

## 2017-07-27 DIAGNOSIS — I613 Nontraumatic intracerebral hemorrhage in brain stem: Principal | ICD-10-CM | POA: Diagnosis present

## 2017-07-27 DIAGNOSIS — R402142 Coma scale, eyes open, spontaneous, at arrival to emergency department: Secondary | ICD-10-CM | POA: Diagnosis present

## 2017-07-27 DIAGNOSIS — I629 Nontraumatic intracranial hemorrhage, unspecified: Secondary | ICD-10-CM | POA: Diagnosis not present

## 2017-07-27 DIAGNOSIS — F329 Major depressive disorder, single episode, unspecified: Secondary | ICD-10-CM | POA: Diagnosis present

## 2017-07-27 DIAGNOSIS — Z7984 Long term (current) use of oral hypoglycemic drugs: Secondary | ICD-10-CM

## 2017-07-27 DIAGNOSIS — H9190 Unspecified hearing loss, unspecified ear: Secondary | ICD-10-CM | POA: Diagnosis present

## 2017-07-27 DIAGNOSIS — Z87820 Personal history of traumatic brain injury: Secondary | ICD-10-CM

## 2017-07-27 DIAGNOSIS — S069X9S Unspecified intracranial injury with loss of consciousness of unspecified duration, sequela: Secondary | ICD-10-CM

## 2017-07-27 DIAGNOSIS — S069X9A Unspecified intracranial injury with loss of consciousness of unspecified duration, initial encounter: Secondary | ICD-10-CM | POA: Diagnosis present

## 2017-07-27 LAB — COMPREHENSIVE METABOLIC PANEL
ALK PHOS: 32 U/L — AB (ref 38–126)
ALT: 13 U/L — ABNORMAL LOW (ref 17–63)
ANION GAP: 12 (ref 5–15)
AST: 23 U/L (ref 15–41)
Albumin: 2.5 g/dL — ABNORMAL LOW (ref 3.5–5.0)
BUN: 10 mg/dL (ref 6–20)
CALCIUM: 5.5 mg/dL — AB (ref 8.9–10.3)
CO2: 14 mmol/L — AB (ref 22–32)
Chloride: 113 mmol/L — ABNORMAL HIGH (ref 101–111)
Creatinine, Ser: 0.62 mg/dL (ref 0.61–1.24)
GFR calc Af Amer: >60 mL/min (ref >60)
GFR calc non Af Amer: >60 mL/min (ref >60)
Glucose, Bld: 202 mg/dL — ABNORMAL HIGH (ref 65–99)
Potassium: <2 mmol/L — CL (ref 3.5–5.1)
Sodium: 139 mmol/L (ref 135–145)
Total Bilirubin: 1 mg/dL (ref 0.3–1.2)
Total Protein: 4.1 g/dL — ABNORMAL LOW (ref 6.5–8.1)

## 2017-07-27 LAB — ETHANOL: Alcohol, Ethyl (B): <10 mg/dL (ref <10)

## 2017-07-27 LAB — I-STAT CHEM 8, ED
BUN: 13 mg/dL (ref 6–20)
CHLORIDE: 95 mmol/L — AB (ref 101–111)
CREATININE: 0.7 mg/dL (ref 0.61–1.24)
Calcium, Ion: 0.96 mmol/L — ABNORMAL LOW (ref 1.15–1.40)
Glucose, Bld: 277 mg/dL — ABNORMAL HIGH (ref 65–99)
HEMATOCRIT: 49 % (ref 39.0–52.0)
Hemoglobin: 16.7 g/dL (ref 13.0–17.0)
POTASSIUM: 3 mmol/L — AB (ref 3.5–5.1)
Sodium: 136 mmol/L (ref 135–145)
TCO2: 22 mmol/L (ref 22–32)

## 2017-07-27 LAB — CBC WITH DIFFERENTIAL/PLATELET
Basophils Absolute: 0 10*3/uL (ref 0.0–0.1)
Basophils Relative: 0 %
EOS PCT: 0 %
Eosinophils Absolute: 0 10*3/uL (ref 0.0–0.7)
HEMATOCRIT: 44.2 % (ref 39.0–52.0)
HEMOGLOBIN: 15.4 g/dL (ref 13.0–17.0)
Lymphocytes Relative: 5 %
Lymphs Abs: 0.6 10*3/uL — ABNORMAL LOW (ref 0.7–4.0)
MCH: 31.2 pg (ref 26.0–34.0)
MCHC: 34.8 g/dL (ref 30.0–36.0)
MCV: 89.5 fL (ref 78.0–100.0)
MONO ABS: 1 10*3/uL (ref 0.1–1.0)
MONOS PCT: 9 %
NEUTROS PCT: 86 %
Neutro Abs: 9.5 10*3/uL — ABNORMAL HIGH (ref 1.7–7.7)
Platelets: 133 10*3/uL — ABNORMAL LOW (ref 150–400)
RBC: 4.94 MIL/uL (ref 4.22–5.81)
RDW: 13.2 % (ref 11.5–15.5)
WBC Morphology: INCREASED
WBC: 11.1 10*3/uL — AB (ref 4.0–10.5)

## 2017-07-27 LAB — PROTIME-INR
INR: 1.44
Prothrombin Time: 17.4 seconds — ABNORMAL HIGH (ref 11.4–15.2)

## 2017-07-27 LAB — TROPONIN I: Troponin I: <0.03 ng/mL (ref <0.03)

## 2017-07-27 LAB — I-STAT ARTERIAL BLOOD GAS, ED
ACID-BASE DEFICIT: 4 mmol/L — AB (ref 0.0–2.0)
BICARBONATE: 21.2 mmol/L (ref 20.0–28.0)
O2 Saturation: 98 %
Patient temperature: 98.6
TCO2: 22 mmol/L (ref 22–32)
pCO2 arterial: 38.3 mmHg (ref 32.0–48.0)
pH, Arterial: 7.35 (ref 7.350–7.450)
pO2, Arterial: 109 mmHg — ABNORMAL HIGH (ref 83.0–108.0)

## 2017-07-27 LAB — TRIGLYCERIDES: Triglycerides: 43 mg/dL (ref <150)

## 2017-07-27 MED ORDER — POTASSIUM CHLORIDE 10 MEQ/100ML IV SOLN: 10.0000 meq | Freq: Once | INTRAVENOUS | Status: DC

## 2017-07-27 MED ORDER — LORAZEPAM 2 MG/ML IJ SOLN: 1.0000 mg | Freq: Once | INTRAMUSCULAR | Status: DC

## 2017-07-27 MED ORDER — ROCURONIUM BROMIDE 50 MG/5ML IV SOLN
INTRAVENOUS | Status: AC | PRN
  Administered 2017-07-27: 70 mg via INTRAVENOUS

## 2017-07-27 MED ORDER — VANCOMYCIN HCL IN DEXTROSE 750-5 MG/150ML-% IV SOLN
750.0000 mg | Freq: Two times a day (BID) | INTRAVENOUS | Status: DC
  Filled 2017-07-27 (×2): qty 150

## 2017-07-27 MED ORDER — ETOMIDATE 2 MG/ML IV SOLN
INTRAVENOUS | Status: AC | PRN
  Administered 2017-07-27: 20 mg via INTRAVENOUS

## 2017-07-27 MED ORDER — PROPOFOL 1000 MG/100ML IV EMUL
INTRAVENOUS | Status: AC | PRN
  Administered 2017-07-27: 20 ug/kg/min via INTRAVENOUS

## 2017-07-27 MED ORDER — PIPERACILLIN-TAZOBACTAM 4.5 G IVPB
3.3750 g | Freq: Once | INTRAVENOUS | Status: DC
  Filled 2017-07-27: qty 100

## 2017-07-27 MED ORDER — ACETAMINOPHEN 650 MG RE SUPP: 650.0000 mg | Freq: Four times a day (QID) | RECTAL | Status: DC | PRN

## 2017-07-27 MED ORDER — SCOPOLAMINE 1 MG/3DAYS TD PT72
1.0000 | MEDICATED_PATCH | TRANSDERMAL | Status: DC
  Administered 2017-07-27: 1.5 mg via TRANSDERMAL
  Filled 2017-07-27: qty 1

## 2017-07-27 MED ORDER — ONDANSETRON HCL 4 MG PO TABS: 4.0000 mg | ORAL_TABLET | Freq: Four times a day (QID) | ORAL | Status: DC | PRN

## 2017-07-27 MED ORDER — LORAZEPAM 2 MG/ML PO CONC
1.0000 mg | ORAL | Status: DC | PRN
Start: 2017-07-27 — End: 2017-07-28

## 2017-07-27 MED ORDER — LORAZEPAM 1 MG PO TABS: 1.0000 mg | ORAL_TABLET | ORAL | Status: DC | PRN

## 2017-07-27 MED ORDER — PROPOFOL 1000 MG/100ML IV EMUL
INTRAVENOUS | Status: AC
  Filled 2017-07-27: qty 100

## 2017-07-27 MED ORDER — MORPHINE 100MG IN NS 100ML (1MG/ML) PREMIX INFUSION
5.0000 mg/h | INTRAVENOUS | Status: DC
  Administered 2017-07-27: 5 mg/h via INTRAVENOUS
  Filled 2017-07-27: qty 100

## 2017-07-27 MED ORDER — ONDANSETRON HCL 4 MG/2ML IJ SOLN: 4.0000 mg | Freq: Four times a day (QID) | INTRAMUSCULAR | Status: DC | PRN

## 2017-07-27 MED ORDER — LORAZEPAM 2 MG/ML IJ SOLN
INTRAMUSCULAR | Status: AC
  Filled 2017-07-27: qty 1

## 2017-07-27 MED ORDER — MORPHINE BOLUS VIA INFUSION
4.0000 mg | INTRAVENOUS | Status: DC | PRN
  Administered 2017-07-27 (×6): 4 mg via INTRAVENOUS

## 2017-07-27 MED ORDER — LORAZEPAM 2 MG/ML IJ SOLN
2.0000 mg | Freq: Once | INTRAMUSCULAR | Status: AC
  Administered 2017-07-27: 2 mg via INTRAVENOUS

## 2017-07-27 MED ORDER — PROPOFOL 1000 MG/100ML IV EMUL: 5.0000 ug/kg/min | INTRAVENOUS | Status: DC

## 2017-07-27 MED ORDER — LORAZEPAM 2 MG/ML IJ SOLN: 1.0000 mg | INTRAMUSCULAR | Status: DC | PRN

## 2017-07-27 MED ORDER — POTASSIUM CHLORIDE 10 MEQ/100ML IV SOLN
10.0000 meq | INTRAVENOUS | Status: DC
Start: 2017-07-27 — End: 2017-07-27

## 2017-07-27 MED ORDER — ACETAMINOPHEN 325 MG PO TABS: 650.0000 mg | ORAL_TABLET | Freq: Four times a day (QID) | ORAL | Status: DC | PRN

## 2017-08-10 NOTE — Discharge Summary (Signed)
Death summary -   Chief Complaint:Unresponsive.  UJW:JXBJY Ludwigis a 84 y.o.malewithhistory of traumatic brain injury, hypertension, history of subdural hematoma, diabetes mellitus type 2 presently not on medication was found to be unresponsive at home and patient daughter return back after work in the evening. Patient also was found to be in short of breath.  ED Course:In the ER patient was initially found to be hypoxic and labored breathing was intubated. CT head was showing large intracranial bleed. On-call neurosurgeon was consulted and felt that patient is not a candidate for an intervention. Likely terminal. After discussing with patient's family was at the bedside it was determined that patient will be terminal and was extubated and placed on morphine drip.   Course in hospital -extensive discussion with patient's family patient was placed on morphine drip.  Patient was declared dead at around 11:20 PM on 2017/08/26.  Patient's family notified.  Discharge diagnosis -   #1.  Cardiorespiratory arrest. #2.  Intracranial bleed. #3.  Acute respiratory failure with hypoxia secondary to intracranial bleed. #4.  Hypertension. #5.  History of diabetes mellitus type 2 and pressing on medication.   Midge Minium. MD. [facility].

## 2017-08-10 NOTE — Discharge Summary (Signed)
Death summary -   Chief Complaint:Unresponsive.  HPI:Steven Duncanis a 84 y.o.malewithhistory of traumatic brain injury, hypertension, history of subdural hematoma, diabetes mellitus type 2 presently not on medication was found to be unresponsive at home and patient daughter return back after work in the evening. Patient also was found to be in short of breath.  ED Course:In the ER patient was initially found to be hypoxic and labored breathing was intubated. CT head was showing large intracranial bleed. On-call neurosurgeon was consulted and felt that patient is not a candidate for an intervention. Likely terminal. After discussing with patient's family was at the bedside it was determined that patient will be terminal and was extubated and placed on morphine drip.   Course in hospital -extensive discussion with patient's family patient was placed on morphine drip.  Patient was declared dead at around 11:20 PM on Jul 27, 2017.  Patient's family notified.  Discharge diagnosis -   #1.  Cardiorespiratory arrest. #2.  Intracranial bleed. #3.  Acute respiratory failure with hypoxia secondary to intracranial bleed. #4.  Hypertension. #5.  History of diabetes mellitus type 2 and pressing on medication.   Arshad Kakrakandy. MD. [facility].   

## 2017-08-26 NOTE — ED Provider Notes (Signed)
  Procedures Procedure Name: Intubation Date/Time: 08-03-17 5:58 PM Performed by: Karrie Meres, PA-C Pre-anesthesia Checklist: Suction available, Patient identified, Timeout performed, Emergency Drugs available and Patient being monitored Oxygen Delivery Method: Non-rebreather mask Preoxygenation: Pre-oxygenation with 100% oxygen Induction Type: Rapid sequence Ventilation: Mask ventilation without difficulty Laryngoscope Size: Glidescope and 4 Tube size: 7.5 mm Number of attempts: 1 Airway Equipment and Method: Video-laryngoscopy Placement Confirmation: ETT inserted through vocal cords under direct vision,  Breath sounds checked- equal and bilateral and CO2 detector (CXR) Secured at: 22 (24) cm Tube secured with: ETT holder        Blannie Shedlock S, PA-C 08-03-17 1810    Pricilla Loveless, MD 07/28/17 0000

## 2017-08-26 NOTE — ED Notes (Addendum)
Paged the chaplain for family comfort. Adam w/ Chaplain Services returned page at [phone] - made him aware of the situation.

## 2017-08-26 NOTE — ED Provider Notes (Signed)
[facility] EMERGENCY DEPARTMENT Provider Note   CSN: [id number] Arrival date & time: 2017/08/07  1726     History   Chief Complaint Chief Complaint  Patient presents with  . Loss of Consciousness    HPI Steven Duncan is a 84 y.o. male.  Level 5 caveat due to altered mental status.  Per EMS patient with last known normal likely earlier this morning.  Patient on 15 L nonrebreather due to hypoxia.  Patient with GCS of less than 8 with EMS.  Patient has history of head bleeds.  Patient found by family.  Patient has history of seizures as well.  Event occurred today, nothing is known to have made it worse or better.  Patient found to have incontinence as well.  The history is provided by the EMS personnel.  Altered Mental Status   This is a new problem. The current episode started 12 to 24 hours ago. The problem has been gradually worsening. Associated symptoms include somnolence. His past medical history is significant for CVA.    Past Medical History:  Diagnosis Date  . Anemia   . Diabetes (HCC)   . Dyslipidemia   . Gait disorder   . HOH (hard of hearing)   . HTN (hypertension)   . Hyponatremia 06/2015  . Subdural hematoma, acute (HCC) 06/2015    Patient Active Problem List   Diagnosis Date Noted  . Intracranial bleed (HCC) 2017-08-07  . Intracranial bleeding (HCC) 2017/08/07  . Acute respiratory failure with hypoxia (HCC)   . Orthostatic hypotension   . TBI (traumatic brain injury) (HCC) 07/01/2015  . 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 Easy fatigability   . Abnormality of gait   . Type 2 diabetes mellitus with complication, without long-term current use of insulin (HCC)   . Cerebral salt-wasting   . HLD (hyperlipidemia)   . Acute subdural hematoma (HCC)   . Cognitive deficit as late effect of traumatic brain injury (HCC)   . Benign essential HTN   . Type 2 diabetes mellitus without complication, without long-term  current use of insulin (HCC)   . HOH (hard of hearing)   . Hyponatremia   . Acute blood loss anemia   . SDH (subdural hematoma) (HCC) 06/26/2015    Past Surgical History:  Procedure Laterality Date  . BRAIN SURGERY  1951   "to relieve pressure from brain due to skull fractures"  . CERVICAL SPINE SURGERY     C5 and C6 hemilaminectomy?        Home Medications    Prior to Admission medications   Medication Sig Start Date End Date Taking? Authorizing Provider  acetaminophen (TYLENOL) 325 MG tablet Take 2 tablets (650 mg total) by mouth every 6 (six) hours as needed for mild pain or headache. 07/14/15   Love, Evlyn Kanner, PA-C  amitriptyline (ELAVIL) 10 MG tablet Take 10 mg by mouth at bedtime.    [provider]  citalopram (CELEXA) 10 MG tablet Take 0.5 tablets (5 mg total) by mouth daily. 07/14/15   Love, Evlyn Kanner, PA-C  glucosamine-chondroitin 500-400 MG tablet Take 1 tablet by mouth 3 (three) times daily.    [provider]  glucose blood test strip 1 each by Other route 2 (two) times daily before a meal. 07/14/15   Love, Evlyn Kanner, PA-C  Lancets MISC by Does not apply route.    [provider]  lisinopril (PRINIVIL,ZESTRIL) 40 MG tablet Take 0.5 tablets (  20 mg total) by mouth daily. 07/14/15   Love, Evlyn Kanner, PA-C  Menthol-Methyl Salicylate (MUSCLE RUB) 10-15 % CREA Apply 1 application topically 3 (three) times daily before meals. 07/14/15   Love, Evlyn Kanner, PA-C  metFORMIN (GLUCOPHAGE) 500 MG tablet Take by mouth 2 (two) times daily with a meal.    [provider]  metoprolol tartrate (LOPRESSOR) 25 MG tablet Take 1 tablet (25 mg total) by mouth 2 (two) times daily. 07/14/15   Love, Evlyn Kanner, PA-C  simvastatin (ZOCOR) 40 MG tablet Take 40 mg by mouth every evening.     [provider]    Family History Family History  Problem Relation Age of Onset  . Diabetes Mellitus II Neg Hx     Social History Social History   Tobacco Use  . Smoking  status: Former Games developer  . Smokeless tobacco: Never Used  . Tobacco comment: I quit smoking years ago  " 1992 "  Substance Use Topics  . Alcohol use: No  . Drug use: No     Allergies   Patient has no known allergies.   Review of Systems Review of Systems  Unable to perform ROS: Acuity of condition     Physical Exam Updated Vital Signs  ED Triage Vitals  Enc Vitals Group     BP 07-31-17 1731 (!) 172/106     Pulse Rate 2017-07-31 1730 100     Resp 07/31/17 1730 (!) 25     Temp --      Temp src --      SpO2 2017-07-31 1730 92 %     Weight 07/31/2017 1728 149 lb (67.6 kg)     Height 2017/07/31 1742  (1.676 m)     Head Circumference --      Peak Flow --      Pain Score --      Pain Loc --      Pain Edu? --      Excl. in GC? --     Physical Exam  Constitutional: He appears distressed.  HENT:  Head: Normocephalic and atraumatic.  Mouth/Throat: Oropharynx is clear and moist. No oropharyngeal exudate.  Eyes: Conjunctivae are normal. Right eye exhibits no discharge. Left eye exhibits no discharge.  Blown pupil on the right side that is nonreactive, 2 mm in the left side nonreactive  Neck: Normal range of motion. Neck supple. No tracheal deviation present.  Cardiovascular: Normal rate, regular rhythm, normal heart sounds and intact distal pulses.  No murmur heard. Pulses:      Dorsalis pedis pulses are 2+ on the right side, and 2+ on the left side.  Pulmonary/Chest: He is in respiratory distress. He has decreased breath sounds. He has no wheezes. He has no rales.  Abdominal: Soft. He exhibits no distension.  Musculoskeletal: He exhibits no deformity.  Neurological: GCS eye subscore is 1. GCS verbal subscore is 1. GCS motor subscore is 1.  Skin: Skin is warm. Capillary refill takes less than 2 seconds.     ED Treatments / Results  Labs (all labs ordered are listed, but only abnormal results are displayed) Labs Reviewed  PROTIME-INR - Abnormal; Notable for the following  components:      Result Value   Prothrombin Time 17.4 (*)    All other components within normal limits  CBC WITH DIFFERENTIAL/PLATELET - Abnormal; Notable for the following components:   WBC 11.1 (*)    Platelets 133 (*)    Neutro Abs 9.5 (*)  Lymphs Abs 0.6 (*)    All other components within normal limits  COMPREHENSIVE METABOLIC PANEL - Abnormal; Notable for the following components:   Potassium <2.0 (*)    Chloride 113 (*)    CO2 14 (*)    Glucose, Bld 202 (*)    Calcium 5.5 (*)    Total Protein 4.1 (*)    Albumin 2.5 (*)    ALT 13 (*)    Alkaline Phosphatase 32 (*)    All other components within normal limits  I-STAT CHEM 8, ED - Abnormal; Notable for the following components:   Potassium 3.0 (*)    Chloride 95 (*)    Glucose, Bld 277 (*)    Calcium, Ion 0.96 (*)    All other components within normal limits  I-STAT ARTERIAL BLOOD GAS, ED - Abnormal; Notable for the following components:   pO2, Arterial 109.0 (*)    Acid-base deficit 4.0 (*)    All other components within normal limits  TRIGLYCERIDES  TROPONIN I  ETHANOL  CBG MONITORING, ED    EKG EKG Interpretation  Date/Time:  Thursday 07/31/17 17:32:06 EDT Ventricular Rate:  106 PR Interval:    QRS Duration: 95 QT Interval:  382 QTC Calculation: 508 R Axis:   63 Text Interpretation:  Atrial fibrillation Repol abnrm, severe global ischemia (LM/MVD) Prolonged QT interval ST depressions new since 2017 Confirmed by Pricilla Loveless [phone]) on 07-31-17 5:46:07 PM   Radiology Ct Head Wo Contrast  Result Date: 07-31-17 CLINICAL DATA:  84 y/o  M; found down. EXAM: CT HEAD WITHOUT CONTRAST TECHNIQUE: Contiguous axial images were obtained from the base of the skull through the vertex without intravenous contrast. COMPARISON:  06/26/2015 CT head. FINDINGS: Brain: Acute hemorrhage centered in right basal ganglia measuring 5.6 x 6.5 x 5.3 cm (volume = 100 cm^3) with decompression into the ventricular system. Large  volume intraventricular hemorrhage with severe hydrocephalus and periventricular interstitial edema. Findings of increased intracranial pressure with effacement of cisterns, cerebellar tonsillar herniation, and sulcal effacement. Vascular: Calcific atherosclerosis of carotid siphons and vertebral arteries. Skull: Normal. Negative for fracture or focal lesion. Sinuses/Orbits: No acute finding. Other: None. IMPRESSION: 1. Acute hemorrhage centered in right basal ganglia measuring up to 6.5 cm, 100 cc. 2. Large volume intraventricular hemorrhage with severe hydrocephalus and periventricular interstitial edema. 3. Findings of increased intracranial pressure with sulcal effacement, cistern effacement, and cerebellar tonsillar herniation. Critical Value/emergent results were called by telephone at the time of interpretation on 07/31/2017 at 6:31 pm to Dr. Pricilla Loveless , who verbally acknowledged these results. Electronically Signed   By: Mitzi Hansen M.D.   On: 07/31/2017 18:37   Dg Chest Portable 1 View  Result Date: 07-31-17 CLINICAL DATA:  Intubation EXAM: PORTABLE CHEST 1 VIEW COMPARISON:  None. FINDINGS: Endotracheal tube terminates 3 cm above the carina. Mild right lower lobe opacity, suspicious for pneumonia. Mild left basilar opacity, possibly atelectasis. No definite pleural effusions. No pneumothorax. The heart is normal in size. Enteric tube terminates in the gastric cardia. IMPRESSION: Endotracheal tube terminates 3 cm above the carina. Mild right lower lobe opacity, suspicious for pneumonia. Electronically Signed   By: Charline Bills M.D.   On: 07/31/2017 18:06    Procedures Procedures (including critical care time)  Medications Ordered in ED Medications  morphine bolus via infusion 4 mg (4 mg Intravenous Bolus from Bag 2017-07-31 2147)  morphine  in NS ( /mL) infusion - premix (5 mg/hr Intravenous New Bag/Given 07-31-2017 1950)  LORazepam (ATIVAN) tablet 1 mg (has no  administration in time range)    Or  LORazepam (ATIVAN) 2 MG/ML concentrated solution 1 mg (has no administration in time range)    Or  LORazepam (ATIVAN) injection 1 mg (has no administration in time range)  acetaminophen (TYLENOL) tablet 650 mg (has no administration in time range)    Or  acetaminophen (TYLENOL) suppository 650 mg (has no administration in time range)  ondansetron (ZOFRAN) tablet 4 mg (has no administration in time range)    Or  ondansetron (ZOFRAN) injection 4 mg (has no administration in time range)  scopolamine (TRANSDERM-SCOP) 1 MG/3DAYS 1.5 mg (has no administration in time range)  LORazepam (ATIVAN) injection 2 mg (2 mg Intravenous Given by Other 08/01/2017 1735)  rocuronium (ZEMURON) injection (70 mg Intravenous Given Aug 01, 2017 1736)  etomidate (AMIDATE) injection (20 mg Intravenous Given Aug 01, 2017 1735)  propofol (DIPRIVAN) 1000 MG/100ML infusion (20 mcg/kg/min  67.6 kg Intravenous New Bag/Given 01-Aug-2017 1745)     Initial Impression / Assessment and Plan / ED Course  I have reviewed the triage vital signs and the nursing notes.  Pertinent labs & imaging results that were available during my care of the patient were reviewed by me and considered in my medical decision making (see chart for details).     Latroy Gaymon is a 84 year old male with history of diabetes, subdural hematoma, hypertension who presents to the ED with altered mental status.  Patient found down by grandson prior to arrival.  Last well-known was about 12 hours ago.  Patient has history of traumatic brain injury.  Patient hypertensive upon arrival.  Otherwise vitals unremarkable.  Patient is on 15 L of oxygen and was hypoxic with EMS.  Patient has blown pupil on the right side and has a GCS of 3.  Patient was intubated with etomidate and rocuronium.  Patient was given Ativan for possible seizure, status epilepticus but concern for head bleed given blown pupil and altered mental status.  Labs were initiated  as well as head CT, chest x-ray, blood cultures.  Head CT shows acute hemorrhage in the right basal ganglia measuring up to about 100 cc.  There is also a large volume intraventricular hemorrhage with severe hydrocephalus and herniation.  Neurosurgery was consulted and Dr. Wynetta Emery states that bleed is nonsurvivable.  Patient was placed on propofol drip and family was at the bedside.  After discussion, patient to be made DNR and comfort care.  Patient extubated compassionately and started on IV morphine drip.  All other medications were discontinued.  Lab work was significant for hypokalemia.  Otherwise unremarkable. Antibiotics and repletion of electrolytes held due to comfort care initiatives.  Patient started on morphine drip and to be admitted to medicine for comfort care with normal vitals while on non-rebreather at 15L.   Final Clinical Impressions(s) / ED Diagnoses   Final diagnoses:  Nontraumatic multiple localized intracerebral hemorrhages, unspecified laterality (HCC)  Acute respiratory failure with hypoxia [facility])    ED Discharge Orders    None       Virgina Norfolk, DO 08-01-17 2237    Pricilla Loveless, MD 07/28/17 0000

## 2017-08-26 NOTE — ED Notes (Signed)
Rn went through Pt's pockets in front of family. Valuables; cash and wallet was placed in bags and given to pt's family.

## 2017-08-26 NOTE — Procedures (Signed)
Extubation Procedure Note  Patient Details:   Name: Steven Duncan DOB: [birth year] MRN: [id number]   Airway Documentation:  Airway 8 mm (Active)  Secured at (cm) 22 cm 08/25/17  7:17 PM  Measured From Lips 08/25/17  7:17 PM  Secured Location Left Aug 25, 2017  7:17 PM  Secured By [employer] 2017-08-25  7:17 PM  Tube Holder Repositioned Yes 2017/08/25  7:17 PM  Site Condition Dry 08-25-2017  5:45 PM   Vent end date: (not recorded) Vent end time: (not recorded)   Evaluation  O2 sats: stable throughout Complications: No apparent complications Patient did tolerate procedure well. Bilateral Breath Sounds: Clear   No   Pt. Extubated to Non-rebreather per MD order and per RT protocol.   Adela Ports 08/25/2017, 8:35 PM

## 2017-08-26 NOTE — Progress Notes (Signed)
Pharmacy Antibiotic Note  Steven Duncan is a 84 y.o. male admitted on 08/18/2017 with possible PNA. Pharmacy has been consulted for vancomycin dosing. Patient also received Zosyn x1 in the ED. CBC in process, no temp documented. Scr 0.7, at baseline. Estimated CrCl ~72 mL/min.  Plan: Vancomycin  IV q12h F/u C&S, clinical status, renal function, de-escalation, LOT, vancomycin levels as indicated  Height:  (167.6 cm) Weight: 149 lb (67.6 kg) IBW/kg (Calculated) : 63.8  No data recorded.  Recent Labs  Lab 2017-08-18 1807  CREATININE 0.70    Estimated Creatinine Clearance: 59.8 mL/min (by C-G formula based on SCr of 0.7 mg/dL).    No Known Allergies  Antimicrobials this admission: Vanc 5/2 >> Zosyn 5/2 x1  Dose adjustments this admission: None  Microbiology results: 5/2 BCx: pending  Thank you for allowing pharmacy to be a part of this patient's care.  Roderic Scarce Zigmund Daniel, PharmD PGY1 Pharmacy Resident Pager: [phone] 08/18/2017 6:20 PM

## 2017-08-26 NOTE — Progress Notes (Signed)
Patient ID: Steven Duncan, male   DOB: Oct 23, 1928, 84 y.o.   MRN: [id number] Asked to review CT..  intra-parenchymal, intra-ventricular, and brainstem hemorrhage is not compatible with survival in this patient.

## 2017-08-26 NOTE — ED Notes (Signed)
Pt continues to have resp.

## 2017-08-26 NOTE — Progress Notes (Signed)
Pt arrived from ED via stretcher. Transferred to the bed. Assessed Pt and he was pale. No lung sounds or breaths taken. No Vitals. Pronounced time of death at 19-Aug-2318. Daughter at bedside.

## 2017-08-26 NOTE — Progress Notes (Signed)
Patient brought in by EMS as unresponsive.  Patient was on non-rebreather mask upon arrival.  Decision was made to intubate patient.  Patient was intubated by ED MD with no complications.  Will obtain post intubation ABG.  Will continue to monitor.

## 2017-08-26 NOTE — ED Triage Notes (Signed)
Per EMS: pt coming from home; Found down by grandson. Pt LKW was 0700. Hx of TBI. Trumpets in both Nares. No response from pain. Pt opened eyes when EMT called name. Pt in A-fib with no Hx. Systolic above 200.

## 2017-08-26 NOTE — ED Notes (Signed)
Pt placed on 10L non rebreather.

## 2017-08-26 NOTE — Progress Notes (Signed)
Patient transported to CT and back to room trauma A without complications.  Once settled patient back into room, obtained post intubation ABG.  ABG obtained on ventilator settings of tidal volume of 520, respiratory rate of 18, FIO2 of 100%, and PEEP of 5.  Will continue to monitor.   Ref. Range 08/26/17 18:19  Sample type Unknown ARTERIAL  pH, Arterial Latest Ref Range: 7.350 - 7.450  7.350  pCO2 arterial Latest Ref Range: 32.0 - 48.0 mmHg 38.3  pO2, Arterial Latest Ref Range: 83.0 - 108.0 mmHg 109.0 (H)  TCO2 Latest Ref Range: 22 - 32 mmol/L 22  Acid-base deficit Latest Ref Range: 0.0 - 2.0 mmol/L 4.0 (H)  Bicarbonate Latest Ref Range: 20.0 - 28.0 mmol/L 21.2  O2 Saturation Latest Units: % 98.0  Patient temperature Unknown 98.6 F  Collection site Unknown RADIAL, [facility] T.Marland KitchenMarland Kitchen

## 2017-08-26 NOTE — H&P (Signed)
History and Physical    Steven Duncan WUJ:[id number] DOB: Apr 16, 1928 DOA: 08-17-17  PCP: Patient, No Pcp Per  Patient coming from: Home.  History obtained from patient's daughter.  Chief Complaint: Unresponsive.  HPI: Steven Duncan is a 84 y.o. male with history of traumatic brain injury, hypertension, history of subdural hematoma, diabetes mellitus type 2 presently not on medication was found to be unresponsive at home and patient daughter return back after work in the evening.  Patient also was found to be in short of breath.  ED Course: In the ER patient was initially found to be hypoxic and labored breathing was intubated.  CT head was showing large intracranial bleed.  On-call neurosurgeon was consulted and felt that patient is not a candidate for an intervention.  Likely terminal.  After discussing with patient's family was at the bedside it was determined that patient will be terminal and was extubated and placed on morphine drip.  Review of Systems: As per HPI, rest all negative.   Past Medical History:  Diagnosis Date  . Anemia   . Diabetes (HCC)   . Dyslipidemia   . Gait disorder   . HOH (hard of hearing)   . HTN (hypertension)   . Hyponatremia 06/2015  . Subdural hematoma, acute (HCC) 06/2015    Past Surgical History:  Procedure Laterality Date  . BRAIN SURGERY  1951   "to relieve pressure from brain due to skull fractures"  . CERVICAL SPINE SURGERY     C5 and C6 hemilaminectomy?     reports that he has quit smoking. He has never used smokeless tobacco. He reports that he does not drink alcohol or use drugs.  No Known Allergies  Family History  Problem Relation Age of Onset  . Diabetes Mellitus II Neg Hx     Prior to Admission medications   Medication Sig Start Date End Date Taking? Authorizing Provider  acetaminophen (TYLENOL) 325 MG tablet Take 2 tablets (650 mg total) by mouth every 6 (six) hours as needed for mild pain or headache. 07/14/15   Love, Evlyn Kanner, PA-C  amitriptyline (ELAVIL) 10 MG tablet Take 10 mg by mouth at bedtime.    [provider]  citalopram (CELEXA) 10 MG tablet Take 0.5 tablets (5 mg total) by mouth daily. 07/14/15   Love, Evlyn Kanner, PA-C  glucosamine-chondroitin 500-400 MG tablet Take 1 tablet by mouth 3 (three) times daily.    [provider]  glucose blood test strip 1 each by Other route 2 (two) times daily before a meal. 07/14/15   Love, Evlyn Kanner, PA-C  Lancets MISC by Does not apply route.    [provider]  lisinopril (PRINIVIL,ZESTRIL) 40 MG tablet Take 0.5 tablets (20 mg total) by mouth daily. 07/14/15   Love, Evlyn Kanner, PA-C  Menthol-Methyl Salicylate (MUSCLE RUB) 10-15 % CREA Apply 1 application topically 3 (three) times daily before meals. 07/14/15   Love, Evlyn Kanner, PA-C  metFORMIN (GLUCOPHAGE) 500 MG tablet Take by mouth 2 (two) times daily with a meal.    [provider]  metoprolol tartrate (LOPRESSOR) 25 MG tablet Take 1 tablet (25 mg total) by mouth 2 (two) times daily. 07/14/15   Love, Evlyn Kanner, PA-C  simvastatin (ZOCOR) 40 MG tablet Take 40 mg by mouth every evening.     [provider]    Physical Exam: Vitals:   08-17-17 2037 08-17-2017 2045 08-17-2017 2100 08-17-17 2117  BP:      Pulse:  85  97   Resp:  (!) 28 (!) 27 (!) 26  Temp:      TempSrc:      SpO2: (!) 52% (!) 75% (!) 75%   Weight:      Height:          Constitutional: Moderately built and nourished. Vitals:   08-08-17 2037 Aug 08, 2017 2045 2017/08/08 2100 Aug 08, 2017 2117  BP:      Pulse:  85 97   Resp:  (!) 28 (!) 27 (!) 26  Temp:      TempSrc:      SpO2: (!) 52% (!) 75% (!) 75%   Weight:      Height:       Eyes: Anicteric no pallor. ENMT: No discharge from the ears eyes nose or mouth. Neck: No mass felt.  No neck rigidity. Respiratory: No rhonchi or crepitations. Cardiovascular: S1-S2 heard no murmurs appreciated. Abdomen: Soft nontender bowel sounds present. Musculoskeletal: No edema.  No  joint effusion. Skin: No rash.  Skin appears warm. Neurologic: Patient is encephalopathic and does not follow commands. Psychiatric: Encephalopathic.   Labs on Admission: I have personally reviewed following labs and imaging studies  CBC: Recent Labs  Lab 08/08/17 1748 08-08-17 1807  WBC 11.1*  --   NEUTROABS 9.5*  --   HGB 15.4 16.7  HCT 44.2 49.0  MCV 89.5  --   PLT 133*  --    Basic Metabolic Panel: Recent Labs  Lab 08/08/2017 1748 Aug 08, 2017 1807  NA 139 136  K <2.0* 3.0*  CL 113* 95*  CO2 14*  --   GLUCOSE 202* 277*  BUN 10 13  CREATININE 0.62 0.70  CALCIUM 5.5*  --    GFR: Estimated Creatinine Clearance: 59.8 mL/min (by C-G formula based on SCr of 0.7 mg/dL). Liver Function Tests: Recent Labs  Lab 2017/08/08 1748  AST 23  ALT 13*  ALKPHOS 32*  BILITOT 1.0  PROT 4.1*  ALBUMIN 2.5*   No results for input(s): LIPASE, AMYLASE in the last 168 hours. No results for input(s): AMMONIA in the last 168 hours. Coagulation Profile: Recent Labs  Lab 2017-08-08 1748  INR 1.44   Cardiac Enzymes: Recent Labs  Lab 08-Aug-2017 1748  TROPONINI <0.03   BNP (last 3 results) No results for input(s): PROBNP in the last 8760 hours. HbA1C: No results for input(s): HGBA1C in the last 72 hours. CBG: No results for input(s): GLUCAP in the last 168 hours. Lipid Profile: Recent Labs    2017/08/08 1748  TRIG 43   Thyroid Function Tests: No results for input(s): TSH, T4TOTAL, FREET4, T3FREE, THYROIDAB in the last 72 hours. Anemia Panel: No results for input(s): VITAMINB12, FOLATE, FERRITIN, TIBC, IRON, RETICCTPCT in the last 72 hours. Urine analysis:    Component Value Date/Time   COLORURINE YELLOW 07/11/2015 1720   APPEARANCEUR CLEAR 07/11/2015 1720   LABSPEC 1.021 07/11/2015 1720   PHURINE 6.0 07/11/2015 1720   GLUCOSEU 100 (A) 07/11/2015 1720   HGBUR NEGATIVE 07/11/2015 1720   BILIRUBINUR NEGATIVE 07/11/2015 1720   KETONESUR NEGATIVE 07/11/2015 1720   PROTEINUR  NEGATIVE 07/11/2015 1720   NITRITE NEGATIVE 07/11/2015 1720   LEUKOCYTESUR NEGATIVE 07/11/2015 1720   Sepsis Labs: (procalcitonin:4,lacticidven:4) )No results found for this or any previous visit (from the past 240 hour(s)).   Radiological Exams on Admission: Ct Head Wo Contrast  Result Date: Aug 08, 2017 CLINICAL DATA:  84 y/o  M; found down. EXAM: CT HEAD WITHOUT CONTRAST TECHNIQUE: Contiguous axial images were obtained from the base  of the skull through the vertex without intravenous contrast. COMPARISON:  06/26/2015 CT head. FINDINGS: Brain: Acute hemorrhage centered in right basal ganglia measuring 5.6 x 6.5 x 5.3 cm (volume = 100 cm^3) with decompression into the ventricular system. Large volume intraventricular hemorrhage with severe hydrocephalus and periventricular interstitial edema. Findings of increased intracranial pressure with effacement of cisterns, cerebellar tonsillar herniation, and sulcal effacement. Vascular: Calcific atherosclerosis of carotid siphons and vertebral arteries. Skull: Normal. Negative for fracture or focal lesion. Sinuses/Orbits: No acute finding. Other: None. IMPRESSION: 1. Acute hemorrhage centered in right basal ganglia measuring up to 6.5 cm, 100 cc. 2. Large volume intraventricular hemorrhage with severe hydrocephalus and periventricular interstitial edema. 3. Findings of increased intracranial pressure with sulcal effacement, cistern effacement, and cerebellar tonsillar herniation. Critical Value/emergent results were called by telephone at the time of interpretation on Aug 21, 2017 at 6:31 pm to Dr. Pricilla Loveless , who verbally acknowledged these results. Electronically Signed   By: Mitzi Hansen M.D.   On: 08-21-2017 18:37   Dg Chest Portable 1 View  Result Date: 08/21/2017 CLINICAL DATA:  Intubation EXAM: PORTABLE CHEST 1 VIEW COMPARISON:  None. FINDINGS: Endotracheal tube terminates 3 cm above the carina. Mild right lower lobe opacity,  suspicious for pneumonia. Mild left basilar opacity, possibly atelectasis. No definite pleural effusions. No pneumothorax. The heart is normal in size. Enteric tube terminates in the gastric cardia. IMPRESSION: Endotracheal tube terminates 3 cm above the carina. Mild right lower lobe opacity, suspicious for pneumonia. Electronically Signed   By: Charline Bills M.D.   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 Intracranial bleed. 2. Diabetes mellitus type 2 presently not on medications. 3. Hypertension. 4. History of traumatic brain injury. 5. History of depression. 6. Acute respiratory failure secondary to intracranial bleed.  Plan -after discussing with neurosurgery was felt that patient was not a candidate for any intervention.  After extensive discussion with patient's family it was decided to make patient comfort measures only.  Patient was extubated and placed on morphine drip.  Patient likely terminal during the stay.   DVT prophylaxis: SCDs. Code Status: DNR. Family Communication: Patient's daughter and family at the bedside. Disposition Plan: Likely terminal. Consults called: ER physician discussed with neurosurgeon. Admission status: Inpatient.   Eduard Clos MD [facility] Pager [phone].  If 7PM-7AM, please contact night-coverage [email or web address] Password [facility]  2017/08/21, 9:35 PM

## 2017-08-26 DEATH — deceased

## 2020-01-21 IMAGING — DX DG CHEST 1V PORT
1 series · 2 of 2 positions shown · non-contrast
Comparison: None.

CLINICAL DATA: Intubation

EXAM:
PORTABLE CHEST 1 VIEW

[Series 1: chest ap · 0.14mm/px · 2 of 2 slices shown]
[im 1/2]
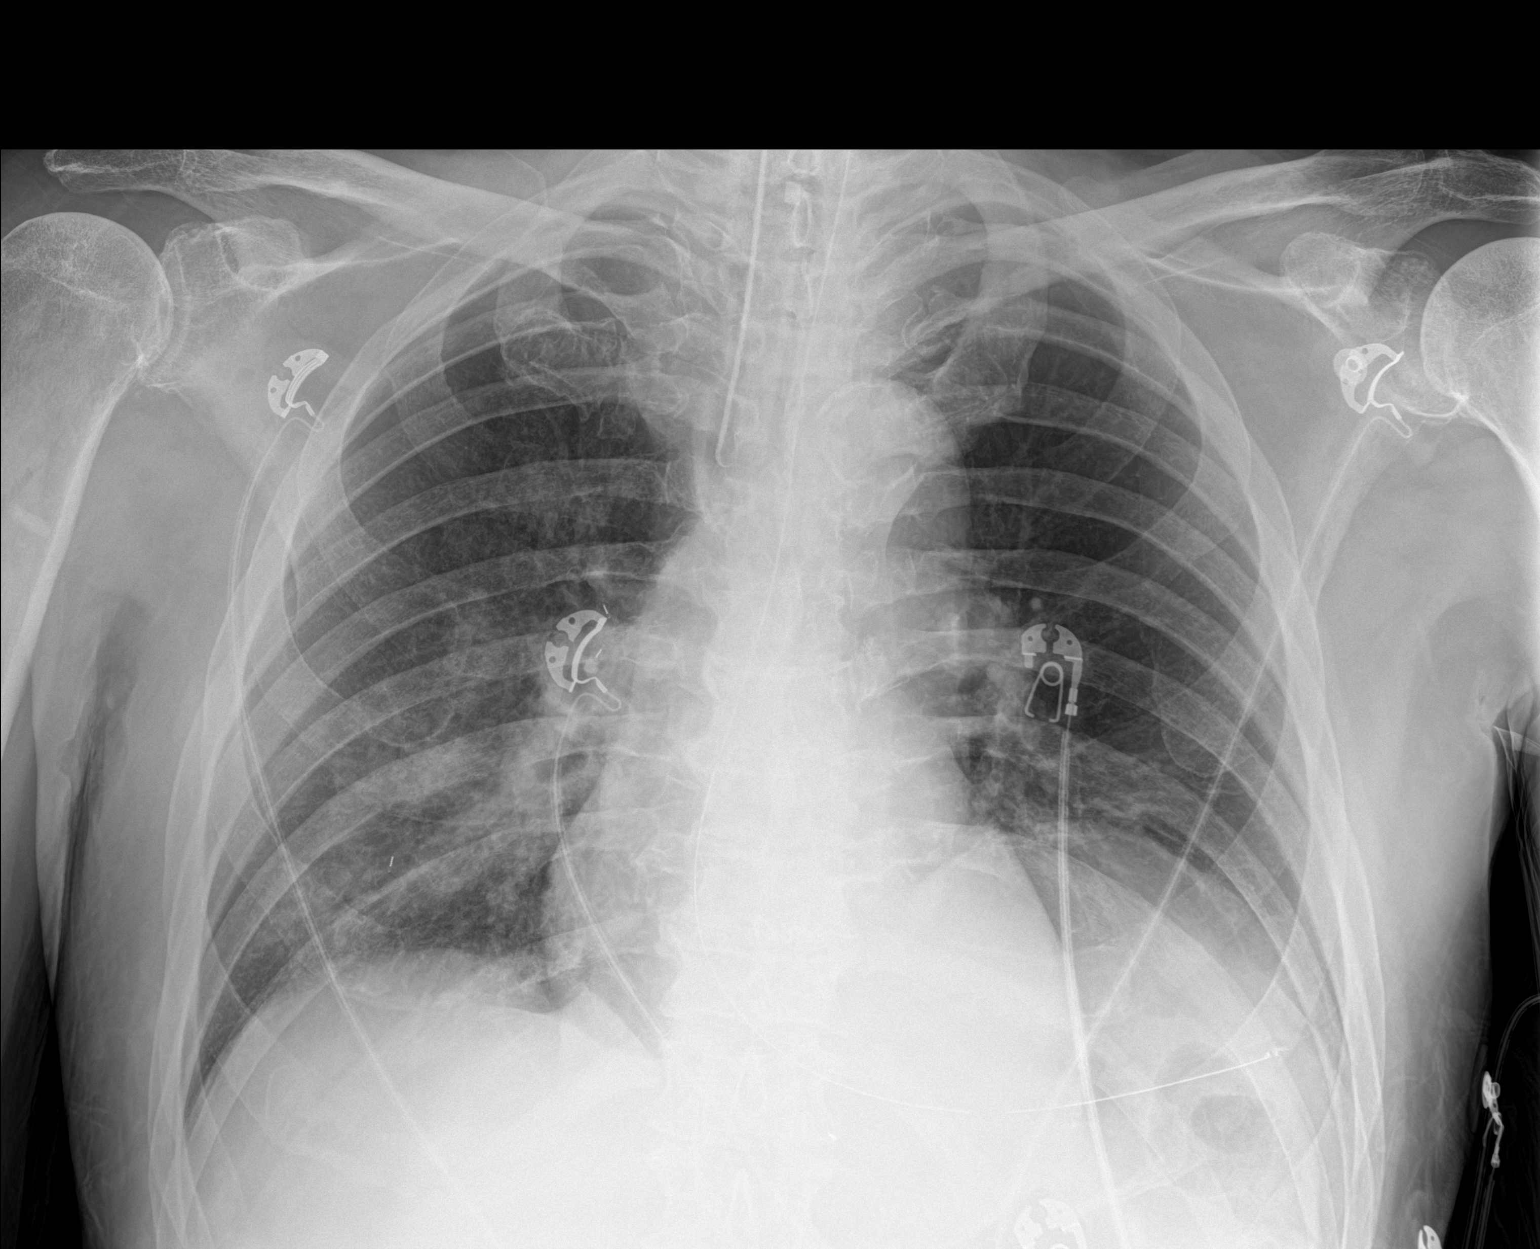
[im 2/2]
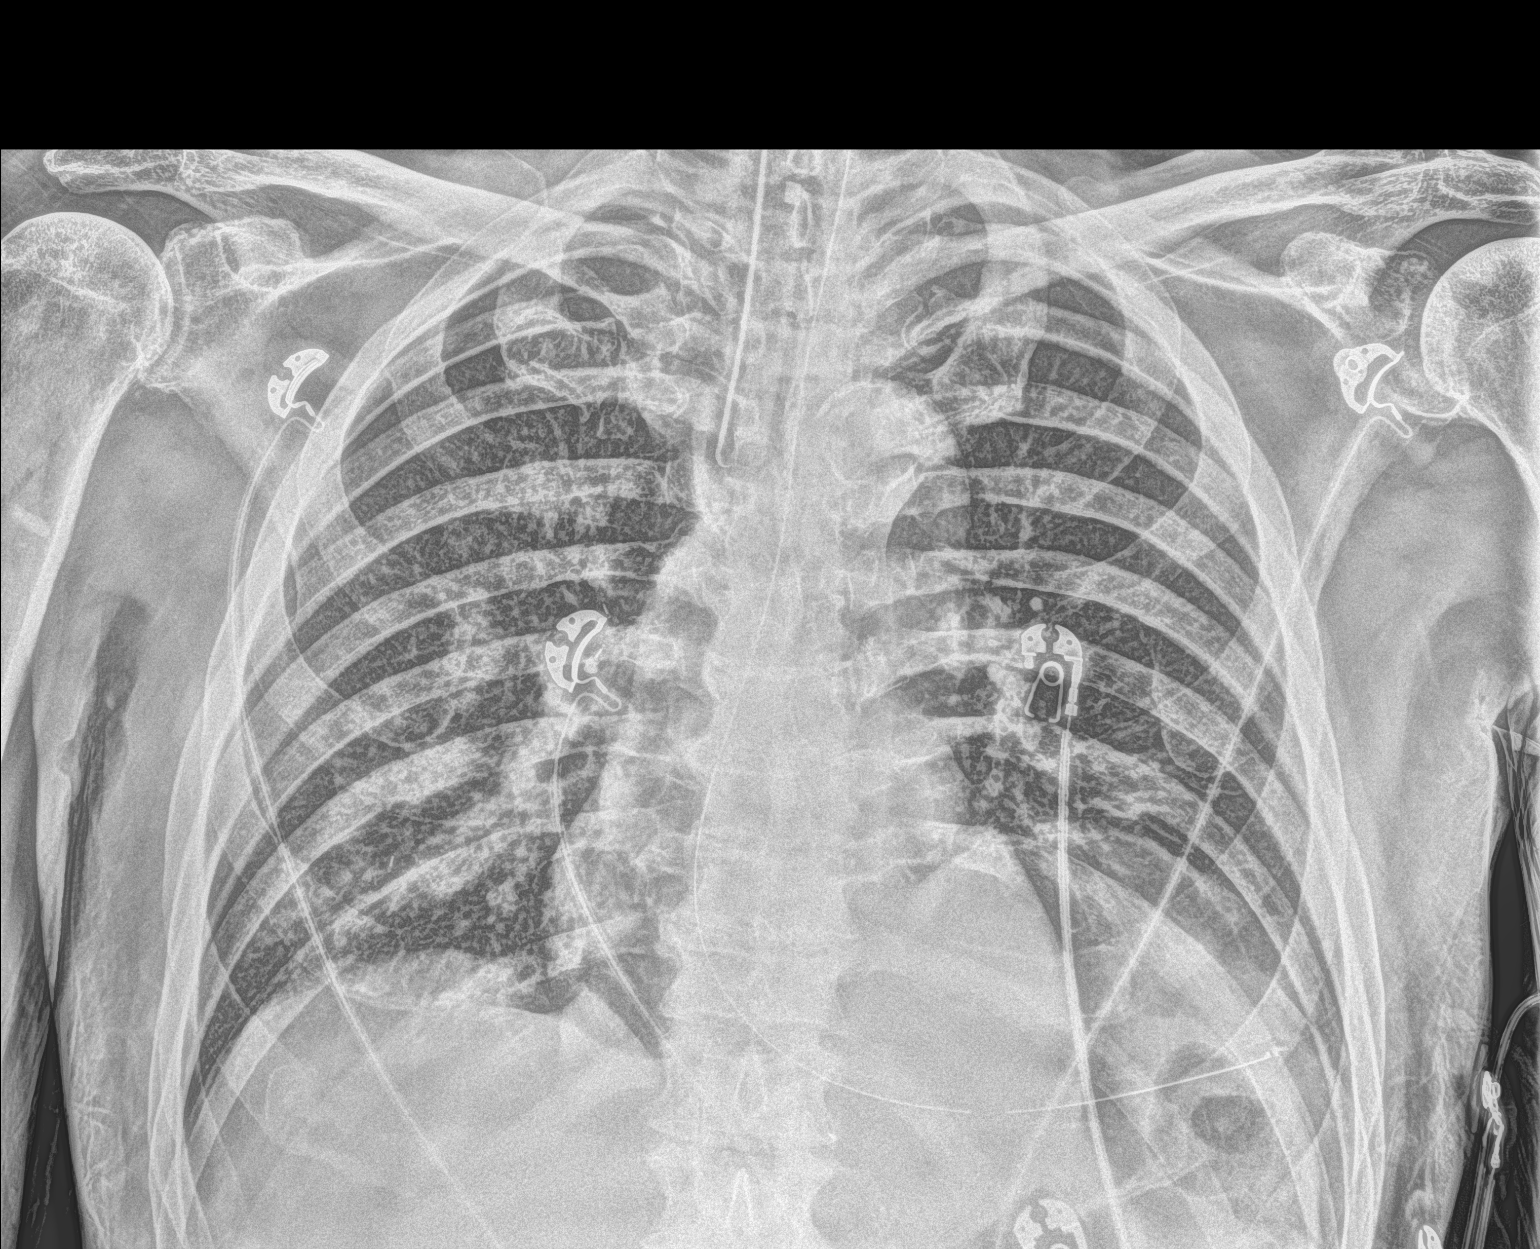

[2 of 2 positions shown; findings below may reference images not displayed]

FINDINGS: Endotracheal tube terminates 3 cm above the carina.

Mild right lower lobe opacity, suspicious for pneumonia. Mild left
basilar opacity, possibly atelectasis. No definite pleural
effusions. No pneumothorax.

The heart is normal in size.

Enteric tube terminates in the gastric cardia.
IMPRESSION: Endotracheal tube terminates 3 cm above the carina.

Mild right lower lobe opacity, suspicious for pneumonia.
